# Patient Record
Sex: Female | Born: 1990 | Race: White | Hispanic: Yes | Marital: Single | Smoking: Never smoker
Health system: Southern US, Community
[De-identification: ages and names within clinical notes are randomized; demographics above are authoritative.]

## PROBLEM LIST (undated history)

## (undated) DIAGNOSIS — T7840XA Allergy, unspecified, initial encounter: Secondary | ICD-10-CM

## (undated) HISTORY — PX: APPENDECTOMY: SHX54

## (undated) HISTORY — DX: Allergy, unspecified, initial encounter: T78.40XA

---

## 2010-06-04 ENCOUNTER — Observation Stay: Payer: Self-pay | Admitting: Surgery

## 2012-04-20 IMAGING — CT CT ABD-PELV W/ CM
1 of 3 series · 15 of 32 positions shown, 19 images · non-contrast
Comparison: none

REASON FOR EXAM: (1) rlq pain; (2) rlq pain
COMMENTS:

PROCEDURE:     CT  - CT ABDOMEN / PELVIS  W  - June 03, 2010 [DATE]
RESULT:     CT pelvis dated 06/03/2010.
TECHNIQUE: Helical 3 mm sections were obtained from the lung bases through
the pubic symphysis status post intravenous administration of 100 mL of
Vsovue-CX8 and oral contrast.

[Series 2: appendicitis · axial · 0.69mm/px · z∈[-527,-128]mm · 15 of 145 slices shown, 19 images]
[im 6/145  soft-tissue]
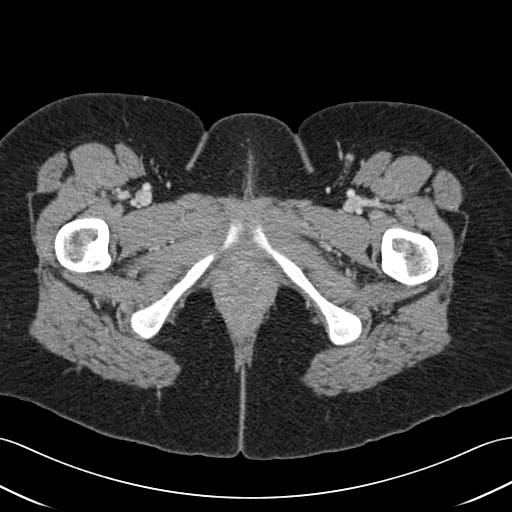
[im 6/145  bone]
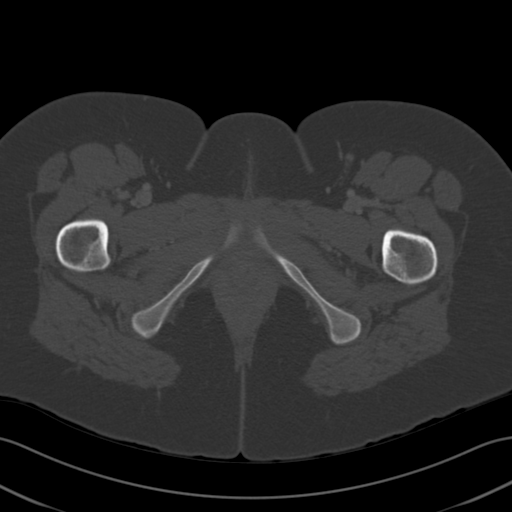
[im 18/145  soft-tissue]
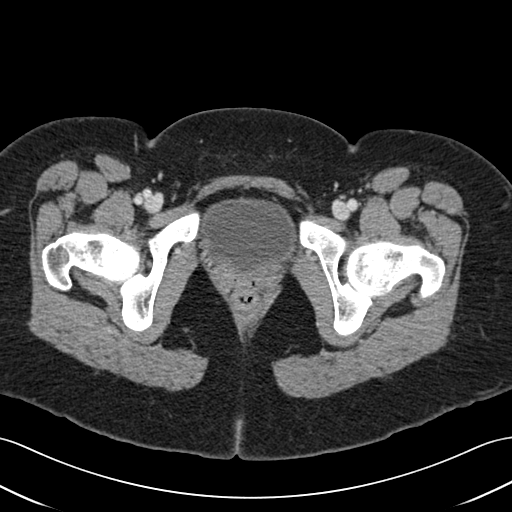
[im 29/145  soft-tissue]
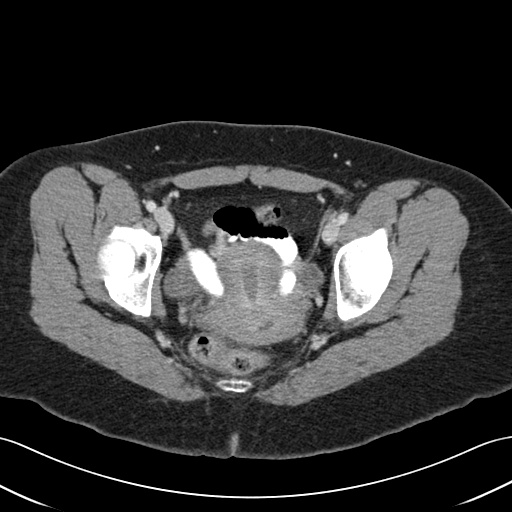
[im 41/145  soft-tissue]
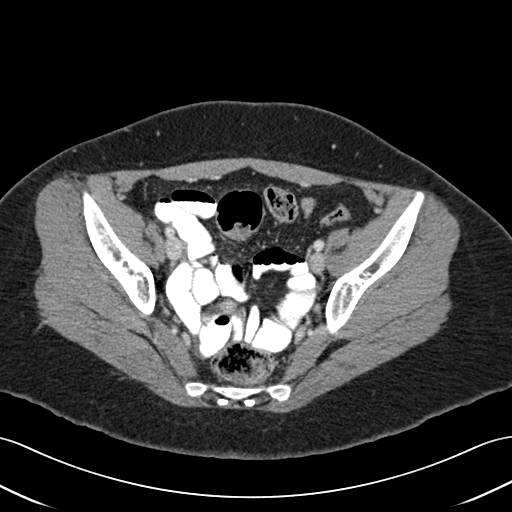
[im 52/145  soft-tissue]
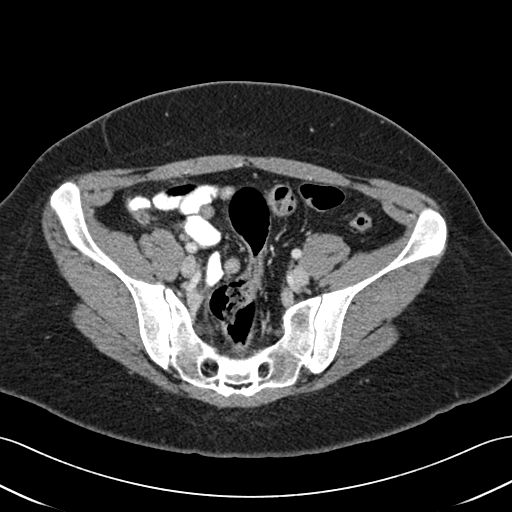
[im 64/145  soft-tissue]
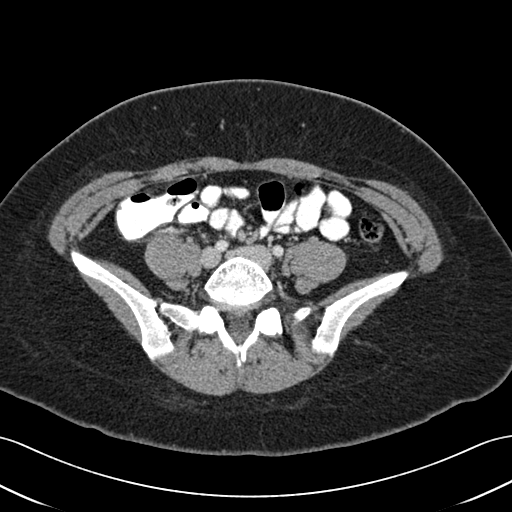
[im 75/145  soft-tissue]
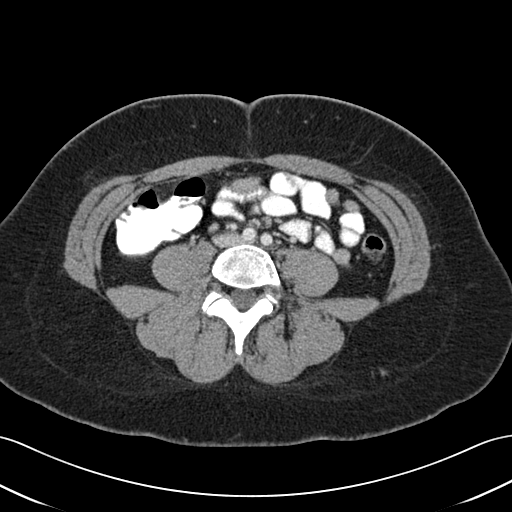
[im 81/145  soft-tissue]
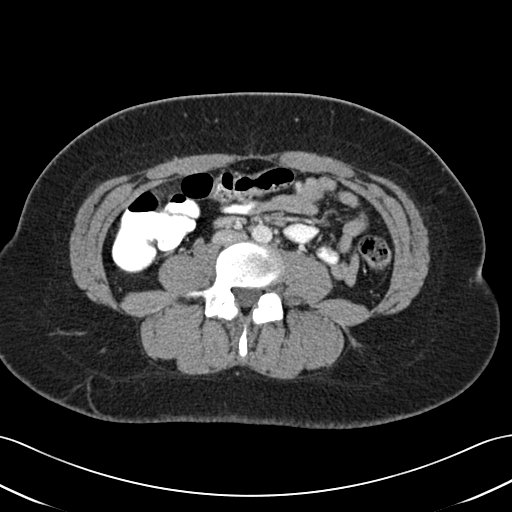
[im 93/145  soft-tissue]
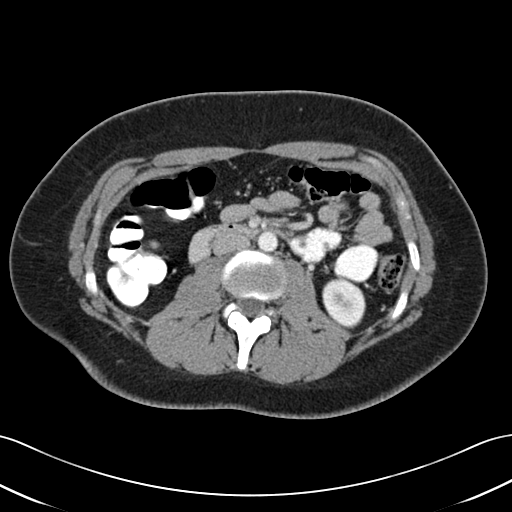
[im 93/145  bone]
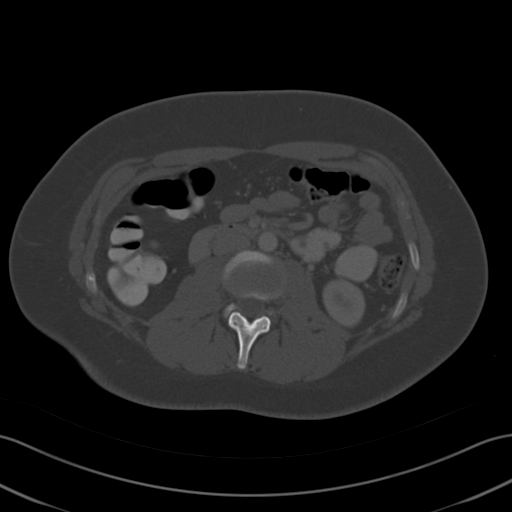
[im 104/145  soft-tissue]
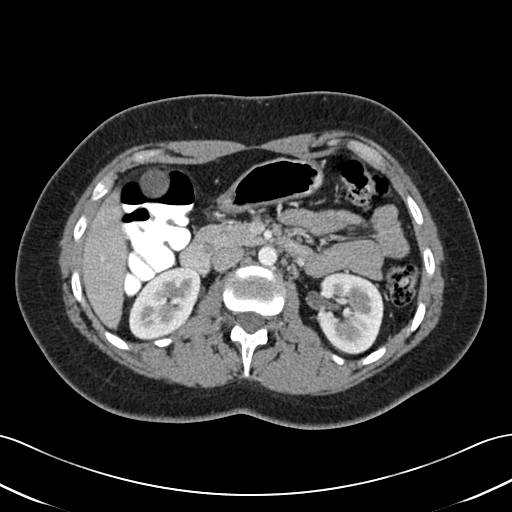
[im 116/145  soft-tissue]
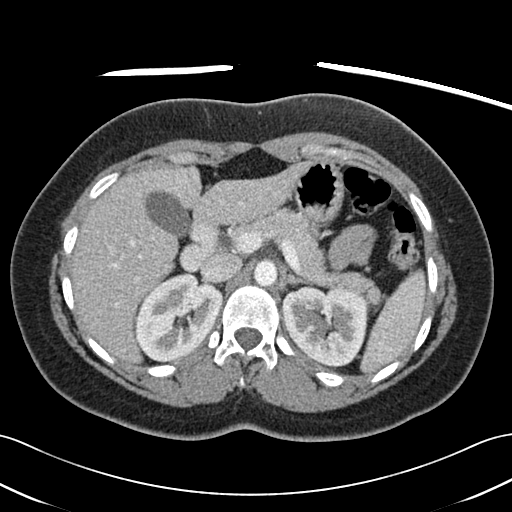
[im 121/145  lung]
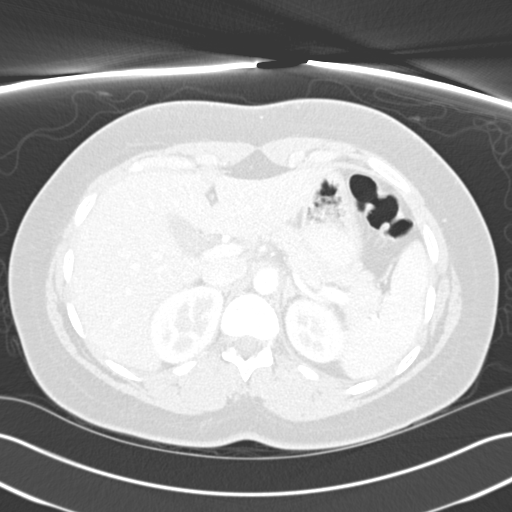
[im 127/145  soft-tissue]
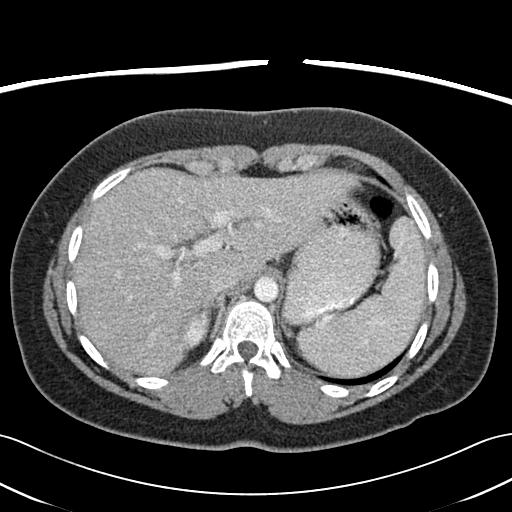
[im 127/145  lung]
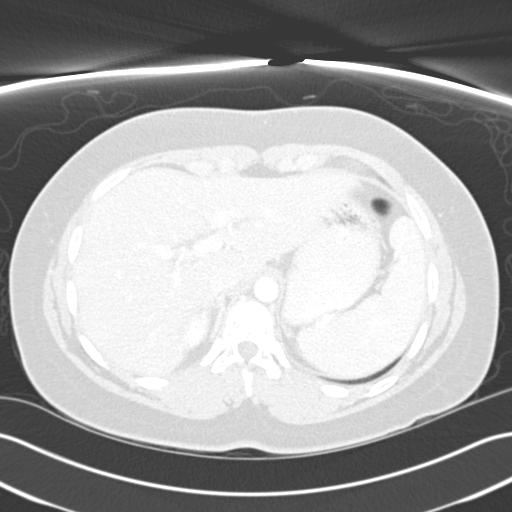
[im 133/145  lung]
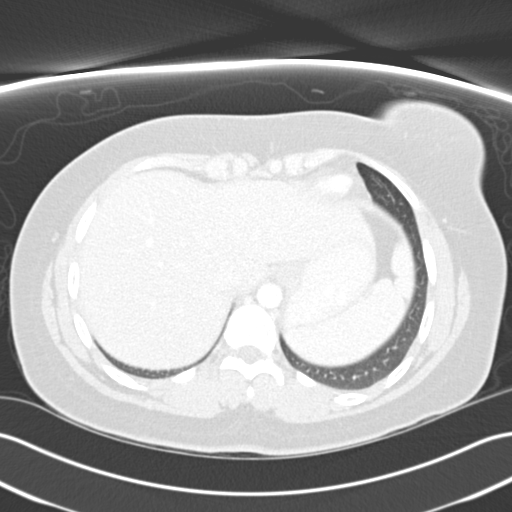
[im 139/145  soft-tissue]
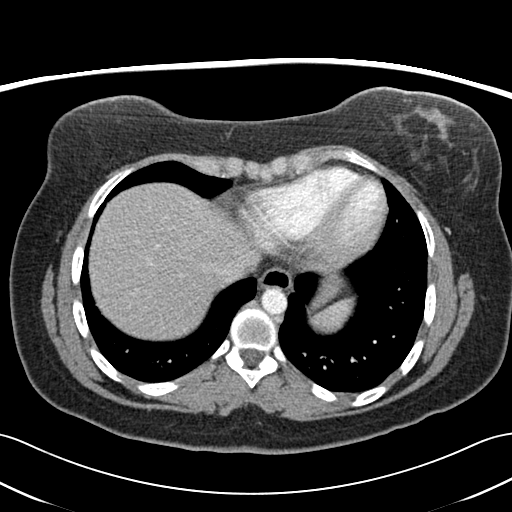
[im 139/145  lung]
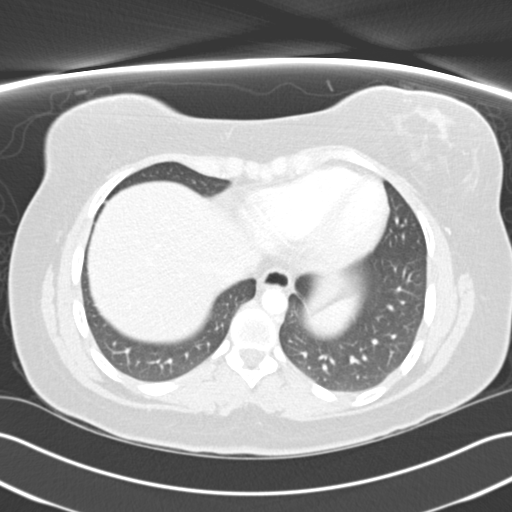

[15 of 32 positions shown; findings below may reference images not displayed]

FINDINGS: The lung bases are unremarkable. The liver, spleen, adrenals,
pancreas, kidneys are unremarkable. There is no evidence of abdominal aortic
aneurysm.

Evaluation of the right paracolic gutter region demonstrates a fluid filled
dilated appendix measuring 8.1 mm in diameter. There is minimal induration
within the periappendiceal fat. There is no evidence of an associated
drainable loculated fluid collection. A small amount of periappendiceal free
fluid appreciated. Within the proximal portion of the appendix an area of
increased density is appreciated representing either contrast within the
appendiceal orifice possibly an appendicolith. The appendix is retrocecal
and rests upon the psoas musculature.

There is no evidence of bowel obstruction, enteritis, colitis nor
diverticulitis.
IMPRESSION: CT findings consistent with early versus mild within the
sinus.
2. Dr. Lary of the emergency department was informed of these findings via a
direct telephone call on 06/04/2010 at [DATE] a.m. Eastern standard time.

## 2019-04-08 ENCOUNTER — Other Ambulatory Visit: Payer: Self-pay

## 2019-04-08 DIAGNOSIS — Z20828 Contact with and (suspected) exposure to other viral communicable diseases: Secondary | ICD-10-CM

## 2019-04-08 DIAGNOSIS — Z20822 Contact with and (suspected) exposure to covid-19: Secondary | ICD-10-CM

## 2019-04-10 LAB — NOVEL CORONAVIRUS, NAA: SARS-CoV-2, NAA: NOT DETECTED

## 2019-05-26 ENCOUNTER — Other Ambulatory Visit: Payer: Self-pay

## 2019-05-26 DIAGNOSIS — Z20822 Contact with and (suspected) exposure to covid-19: Secondary | ICD-10-CM

## 2019-05-27 LAB — NOVEL CORONAVIRUS, NAA: SARS-CoV-2, NAA: NOT DETECTED

## 2019-09-06 ENCOUNTER — Other Ambulatory Visit: Payer: Self-pay

## 2019-09-06 ENCOUNTER — Ambulatory Visit: Payer: Self-pay | Attending: Internal Medicine

## 2019-09-06 DIAGNOSIS — Z23 Encounter for immunization: Secondary | ICD-10-CM | POA: Insufficient documentation

## 2019-09-06 NOTE — Progress Notes (Signed)
   Covid-19 Vaccination Clinic  Name:  ADILYNNE FITZWATER    MRN: [id number] DOB: 1991-05-05  09/06/2019  Ms. Eckerson was observed post Covid-19 immunization for 15 minutes without incident. She was provided with Vaccine Information Sheet and instruction to access the V-Safe system.   Ms. Jamison was instructed to call 911 with any severe reactions post vaccine: Marland Kitchen Difficulty breathing  . Swelling of face and throat  . A fast heartbeat  . A bad rash all over body  . Dizziness and weakness   Immunizations Administered    Name Date Dose VIS Date Route   Moderna COVID-19 Vaccine 09/06/2019  2:44 PM 0.5 mL 06/03/2019 Intramuscular   Manufacturer: Moderna   Lot: [id number]   NDC: 14643-142-76

## 2019-09-08 ENCOUNTER — Ambulatory Visit: Payer: Self-pay

## 2019-10-04 ENCOUNTER — Ambulatory Visit: Payer: Self-pay

## 2019-10-04 ENCOUNTER — Ambulatory Visit: Payer: Self-pay | Attending: Internal Medicine

## 2019-10-04 DIAGNOSIS — Z23 Encounter for immunization: Secondary | ICD-10-CM

## 2019-10-04 NOTE — Progress Notes (Signed)
   Covid-19 Vaccination Clinic  Name:  Melanie Bailey    MRN: [id number] DOB: 04/01/1991  10/04/2019  [name] was observed post Covid-19 immunization for 15 minutes without incident. She was provided with Vaccine Information Sheet and instruction to access the V-Safe system.   Ms. Show was instructed to call 911 with any severe reactions post vaccine: Marland Kitchen Difficulty breathing  . Swelling of face and throat  . A fast heartbeat  . A bad rash all over body  . Dizziness and weakness   Immunizations Administered    Name Date Dose VIS Date Route   Moderna COVID-19 Vaccine 10/04/2019  2:44 PM 0.5 mL 06/03/2019 Intramuscular   Manufacturer: Gala Murdoch   Lot: [id number]   NDC: 37357-897-84

## 2020-06-14 ENCOUNTER — Telehealth: Payer: Self-pay

## 2020-06-14 NOTE — Telephone Encounter (Signed)
Called to schedule np appt with Dr Laural Benes in the new year, no answer, left vm

## 2020-06-18 NOTE — Telephone Encounter (Signed)
Patient returned call and was advised to call back in January, 2022 to schedule a NPA with Dr. Laural Benes, patient voice understanding.

## 2020-12-03 ENCOUNTER — Ambulatory Visit: Payer: Self-pay | Admitting: Nurse Practitioner

## 2021-03-08 ENCOUNTER — Encounter: Payer: Self-pay | Admitting: Nurse Practitioner

## 2021-03-08 ENCOUNTER — Ambulatory Visit (INDEPENDENT_AMBULATORY_CARE_PROVIDER_SITE_OTHER): Payer: 59 | Admitting: Nurse Practitioner

## 2021-03-08 ENCOUNTER — Other Ambulatory Visit: Payer: Self-pay

## 2021-03-08 VITALS — BP 114/77 | HR 90 | Temp 98.9°F | Ht 64.0 in | Wt 199.8 lb

## 2021-03-08 DIAGNOSIS — E669 Obesity, unspecified: Secondary | ICD-10-CM | POA: Insufficient documentation

## 2021-03-08 DIAGNOSIS — E6609 Other obesity due to excess calories: Secondary | ICD-10-CM

## 2021-03-08 DIAGNOSIS — Z6834 Body mass index (BMI) 34.0-34.9, adult: Secondary | ICD-10-CM | POA: Diagnosis not present

## 2021-03-08 DIAGNOSIS — Z7689 Persons encountering health services in other specified circumstances: Secondary | ICD-10-CM

## 2021-03-08 DIAGNOSIS — Z0000 Encounter for general adult medical examination without abnormal findings: Secondary | ICD-10-CM | POA: Insufficient documentation

## 2021-03-08 NOTE — Assessment & Plan Note (Signed)
BMI 34.30.  Recommended eating smaller high protein, low fat meals more frequently and exercising 30 mins a day 5 times a week with a goal of 10-15lb weight loss in the next 3 months. Patient voiced their understanding and motivation to adhere to these recommendations.  

## 2021-03-08 NOTE — Assessment & Plan Note (Signed)
New Patient == current needs: - Has not had pap, discussed with patient and will perform at physical in 4 weeks - She will obtain past vaccine records from her mom and provide to PCP for review - Hep C and HIV screening at physical, discussed with patient.

## 2021-03-08 NOTE — Progress Notes (Signed)
New Patient Office Visit  Subjective:  Patient ID: Melanie Bailey, adult    DOB: [birth year]  Age: 30 y.o. MRN: [id number]  CC:  Chief Complaint  Patient presents with   Establish Care    Patient is here to establish care. Patient denies having any concerns at today's visit.     HPI Melanie Bailey presents for new patient visit to establish care.  Introduced to Publishing rights manager role and practice setting.  All questions answered.  Discussed provider/patient relationship and expectations.  Has not seen primary care provider in some time.   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  Mother has multiple sclerosis and grandmother with thyroid issues. Patient reports overall no chronic health issues, but wishes to have PCP for annual physicals.    Has never had a pap smear, not currently sexually active, but was in past.  She can get old immunization records from her mother.    History reviewed. No pertinent past medical history.  Past Surgical History:  Procedure Laterality Date   APPENDECTOMY      Family History  Problem Relation Age of Onset   Multiple sclerosis Mother     Social History   Socioeconomic History   Marital status: Single    Spouse name: Not on file   Number of children: Not on file   Years of education: Not on file   Highest education level: Not on file  Occupational History   Not on file  Tobacco Use   Smoking status: Never   Smokeless tobacco: Never  Vaping Use   Vaping Use: Never used  Substance and Sexual Activity   Alcohol use: Yes    Comment: socially   Drug use: Never   Sexual activity: Not Currently  Other Topics Concern   Not on file  Social History Narrative   Not on file   Social Determinants of Health   Financial Resource Strain: Low Risk    Difficulty of Paying Living Expenses: Not hard at all  Food Insecurity: No Food Insecurity    Worried About Programme researcher, broadcasting/film/video in the Last Year: Never true   Ran Out of Food in the Last Year: Never true  Transportation Needs: No Transportation Needs   Lack of Transportation (Medical): No   Lack of Transportation (Non-Medical): No  Physical Activity: Inactive   Days of Exercise per Week: 0 days   Minutes of Exercise per Session: 0 min  Stress: Stress Concern Present   Feeling of Stress : To some extent  Social Connections: Socially Isolated   Frequency of Communication with Friends and Family: More than three times a week   Frequency of Social Gatherings with Friends and Family: More than three times a week   Attends Religious Services: Never   Database administrator or Organizations: No   Attends Engineer, structural: Never   Marital Status: Never married  Catering manager Violence: Not At Risk   Fear of Current or Ex-Partner: No   Emotionally Abused: No   Physically Abused: No   Sexually Abused: No    ROS Review of Systems  Constitutional:  Negative for activity change, appetite change, diaphoresis, fatigue and fever.  Respiratory:  Negative for cough, chest tightness and shortness of breath.   Cardiovascular:  Negative for chest pain, palpitations and leg swelling.  Gastrointestinal: Negative.   Neurological: Negative.  Psychiatric/Behavioral: Negative.     Objective:   Today's Vitals: BP 114/77   Pulse 90   Temp 98.9 F (37.2 C) (Oral)   Ht 5\' 4"  (1.626 m)   Wt 199 lb 12.8 oz (90.6 kg)   LMP 02/11/2021   SpO2 98%   BMI 34.30 kg/m   Physical Exam Vitals and nursing note reviewed.  Constitutional:      General: Melanie Bailey is awake. Melanie Bailey is not in acute distress.    Appearance: Melanie Bailey is well-developed and well-groomed. Melanie Bailey is obese. Melanie Bailey is not ill-appearing or toxic-appearing.  HENT:     Head: Normocephalic.     Right Ear: Hearing normal.     Left Ear: Hearing normal.  Eyes:      General: Lids are normal.        Right eye: No discharge.        Left eye: No discharge.     Conjunctiva/sclera: Conjunctivae normal.     Pupils: Pupils are equal, round, and reactive to light.  Neck:     Thyroid: No thyromegaly.     Vascular: No carotid bruit.  Cardiovascular:     Rate and Rhythm: Normal rate and regular rhythm.     Heart sounds: Normal heart sounds. No murmur heard.   No gallop.  Pulmonary:     Effort: Pulmonary effort is normal. No accessory muscle usage or respiratory distress.     Breath sounds: Normal breath sounds.  Abdominal:     General: Bowel sounds are normal.     Palpations: Abdomen is soft.  Musculoskeletal:     Cervical back: Normal range of motion and neck supple.     Right lower leg: No edema.     Left lower leg: No edema.  Lymphadenopathy:     Cervical: No cervical adenopathy.  Skin:    General: Skin is warm and dry.  Neurological:     Mental Status: Melanie Bailey is alert and oriented to person, place, and time.  Psychiatric:        Attention and Perception: Attention normal.        Mood and Affect: Mood normal.        Speech: Speech normal.        Behavior: Behavior normal. Behavior is cooperative.        Thought Content: Thought content normal.   Assessment & Plan:   Problem List Items Addressed This Visit       Other   Obesity    BMI 34.30.  Recommended eating smaller high protein, low fat meals more frequently and exercising 30 mins a day 5 times a week with a goal of 10-15lb weight loss in the next 3 months. Patient voiced their understanding and motivation to adhere to these recommendations.       Healthcare maintenance    New Patient == current needs: - Has not had pap, discussed with patient and will perform at physical in 4 weeks - She will obtain past vaccine records from her mom and provide to PCP for review - Hep C and HIV screening at physical, discussed with patient.      Other Visit Diagnoses     Encounter  to establish care    -  Primary   Introduced to practice and provider.       Outpatient Encounter Medications as of 03/08/2021  Medication Sig   loratadine (CLARITIN) 10 MG tablet Take 10 mg by  mouth daily.   No facility-administered encounter medications on file as of 03/08/2021.    Follow-up: Return in about 4 weeks (around 04/05/2021) for Annual physical with pap.   Marjie Skiff, NP

## 2021-03-08 NOTE — Patient Instructions (Signed)

## 2021-04-06 ENCOUNTER — Encounter: Payer: Self-pay | Admitting: Family Medicine

## 2021-04-06 ENCOUNTER — Telehealth (INDEPENDENT_AMBULATORY_CARE_PROVIDER_SITE_OTHER): Payer: 59 | Admitting: Family Medicine

## 2021-04-06 DIAGNOSIS — R300 Dysuria: Secondary | ICD-10-CM | POA: Diagnosis not present

## 2021-04-06 DIAGNOSIS — N3000 Acute cystitis without hematuria: Secondary | ICD-10-CM | POA: Diagnosis not present

## 2021-04-06 LAB — URINALYSIS, ROUTINE W REFLEX MICROSCOPIC
Bilirubin, UA: NEGATIVE
Glucose, UA: NEGATIVE
Ketones, UA: NEGATIVE
Nitrite, UA: NEGATIVE
Protein,UA: NEGATIVE
Specific Gravity, UA: 1.02 (ref 1.005–1.030)
Urobilinogen, Ur: 0.2 mg/dL (ref 0.2–1.0)
pH, UA: 6.5 (ref 5.0–7.5)

## 2021-04-06 LAB — MICROSCOPIC EXAMINATION

## 2021-04-06 MED ORDER — NITROFURANTOIN MONOHYD MACRO 100 MG PO CAPS: 100.0000 mg | ORAL_CAPSULE | Freq: Two times a day (BID) | ORAL | 0 refills | Status: DC

## 2021-04-06 NOTE — Progress Notes (Signed)
There were no vitals taken for this visit.   Subjective:    Patient ID: Melanie Bailey, adult    DOB: 1990-12-17, 30 y.o.   MRN: [id number]  HPI: Melanie Bailey is a 30 y.o. adult  Chief Complaint  Patient presents with   Urinary Frequency   Dysuria    Started last night. Patient has taken Azo this morning   URINARY SYMPTOMS Duration: 1 day Dysuria: yes Urinary frequency: yes Urgency: yes Small volume voids: yes Symptom severity: moderate Urinary incontinence: no Foul odor: no Hematuria: no Abdominal pain: no Back pain: no Suprapubic pain/pressure: yes Flank pain: no Fever:  subjective Vomiting: no Relief with cranberry juice: no Relief with pyridium: yes Status: worse Previous urinary tract infection: yes Recurrent urinary tract infection: no History of sexually transmitted disease: no Vaginal discharge: no Treatments attempted: pyridium   Relevant past medical, surgical, family and social history reviewed and updated as indicated. Interim medical history since our last visit reviewed. Allergies and medications reviewed and updated.  Review of Systems  Constitutional: Negative.   Respiratory: Negative.    Cardiovascular: Negative.   Gastrointestinal: Negative.   Genitourinary:  Positive for dysuria, frequency and urgency. Negative for decreased urine volume, difficulty urinating, dyspareunia, enuresis, flank pain, genital sores, hematuria, menstrual problem, pelvic pain, vaginal bleeding, vaginal discharge and vaginal pain.  Psychiatric/Behavioral: Negative.     Per HPI unless specifically indicated above     Objective:    There were no vitals taken for this visit.  Wt Readings from Last 3 Encounters:  03/08/21 199 lb 12.8 oz (90.6 kg)    Physical Exam Vitals and nursing note reviewed.  Constitutional:      General: Melanie Bailey is not in acute distress.    Appearance: Normal appearance. Melanie Bailey is not ill-appearing,  toxic-appearing or diaphoretic.  HENT:     Head: Normocephalic and atraumatic.     Right Ear: External ear normal.     Left Ear: External ear normal.     Nose: Nose normal.     Mouth/Throat:     Mouth: Mucous membranes are moist.     Pharynx: Oropharynx is clear.  Eyes:     General: No scleral icterus.       Right eye: No discharge.        Left eye: No discharge.     Conjunctiva/sclera: Conjunctivae normal.     Pupils: Pupils are equal, round, and reactive to light.  Pulmonary:     Effort: Pulmonary effort is normal. No respiratory distress.     Comments: Speaking in full sentences Musculoskeletal:        General: Normal range of motion.     Cervical back: Normal range of motion.  Skin:    Coloration: Skin is not jaundiced or pale.     Findings: No bruising, erythema, lesion or rash.  Neurological:     Mental Status: Melanie Bailey is alert and oriented to person, place, and time. Mental status is at baseline.  Psychiatric:        Mood and Affect: Mood normal.        Behavior: Behavior normal.        Thought Content: Thought content normal.        Judgment: Judgment normal.    Results for orders placed or performed in visit on 05/26/19  Novel Coronavirus, NAA ([facility])   Specimen: Nasopharyngeal(NP) swabs in vial transport medium   NASOPHARYNGE  TESTING  Result Value  Ref Range   SARS-CoV-2, NAA Not Detected Not Detected      Assessment & Plan:   Problem List Items Addressed This Visit   None Visit Diagnoses     Acute cystitis without hematuria    -  Primary   Will come in to drop off urine. Start nitrofurantoin. Call with any concerns or if not gettng better.    Relevant Orders   Urinalysis, Routine w reflex microscopic   Urine Culture   Dysuria       Relevant Orders   Urinalysis, Routine w reflex microscopic        Follow up plan: Return if symptoms worsen or fail to improve.    This visit was completed via video visit through MyChart due to the  restrictions of the COVID-19 pandemic. All issues as above were discussed and addressed. Physical exam was done as above through visual confirmation on video through MyChart. If it was felt that the patient should be evaluated in the office, they were directed there. The patient verbally consented to this visit. Location of the patient: home Location of the provider: work Those involved with this call:  Provider: Olevia Perches, DO CMA: Tristan Schroeder, CMA Front Desk/Registration: Kandice Hams  Time spent on call:  15 minutes with patient face to face via video conference. More than 50% of this time was spent in counseling and coordination of care. 23 minutes total spent in review of patient's record and preparation of their chart.

## 2021-04-06 NOTE — Addendum Note (Signed)
Addended byMortimer Fries on: 04/06/2021 12:01 PM   Modules accepted: Orders

## 2021-04-09 LAB — URINE CULTURE: Organism ID, Bacteria: NO GROWTH

## 2021-04-12 ENCOUNTER — Other Ambulatory Visit: Payer: Self-pay

## 2021-04-12 ENCOUNTER — Encounter: Payer: Self-pay | Admitting: Nurse Practitioner

## 2021-04-12 ENCOUNTER — Ambulatory Visit (INDEPENDENT_AMBULATORY_CARE_PROVIDER_SITE_OTHER): Payer: 59 | Admitting: Nurse Practitioner

## 2021-04-12 ENCOUNTER — Other Ambulatory Visit (HOSPITAL_COMMUNITY)
Admission: RE | Admit: 2021-04-12 | Discharge: 2021-04-12 | Disposition: A | Discharge status: Home / Self Care | Payer: 59 | Source: Ambulatory Visit | Attending: Nurse Practitioner | Admitting: Nurse Practitioner

## 2021-04-12 VITALS — BP 114/80 | HR 76 | Temp 98.5°F | Ht 63.5 in | Wt 201.6 lb

## 2021-04-12 DIAGNOSIS — E6609 Other obesity due to excess calories: Secondary | ICD-10-CM | POA: Diagnosis not present

## 2021-04-12 DIAGNOSIS — Z6834 Body mass index (BMI) 34.0-34.9, adult: Secondary | ICD-10-CM

## 2021-04-12 DIAGNOSIS — Z0000 Encounter for general adult medical examination without abnormal findings: Secondary | ICD-10-CM

## 2021-04-12 DIAGNOSIS — Z124 Encounter for screening for malignant neoplasm of cervix: Secondary | ICD-10-CM | POA: Insufficient documentation

## 2021-04-12 DIAGNOSIS — E559 Vitamin D deficiency, unspecified: Secondary | ICD-10-CM

## 2021-04-12 DIAGNOSIS — Z8249 Family history of ischemic heart disease and other diseases of the circulatory system: Secondary | ICD-10-CM | POA: Diagnosis not present

## 2021-04-12 LAB — URINALYSIS, ROUTINE W REFLEX MICROSCOPIC
Bilirubin, UA: NEGATIVE
Glucose, UA: NEGATIVE
Ketones, UA: NEGATIVE
Leukocytes,UA: NEGATIVE
Nitrite, UA: NEGATIVE
Protein,UA: NEGATIVE
RBC, UA: NEGATIVE
Specific Gravity, UA: 1.025 (ref 1.005–1.030)
Urobilinogen, Ur: 0.2 mg/dL (ref 0.2–1.0)
pH, UA: 5.5 (ref 5.0–7.5)

## 2021-04-12 NOTE — Assessment & Plan Note (Signed)
BMI 35.15.  Recommended eating smaller high protein, low fat meals more frequently and exercising 30 mins a day 5 times a week with a goal of 10-15lb weight loss in the next 3 months. Patient voiced their understanding and motivation to adhere to these recommendations. ° °

## 2021-04-12 NOTE — Patient Instructions (Signed)

## 2021-04-12 NOTE — Progress Notes (Signed)
BP 114/80   Pulse 76   Temp 98.5 F (36.9 C) (Oral)   Ht 5' 3.5" (1.613 m)   Wt 201 lb 9.6 oz (91.4 kg)   LMP 04/06/2021 (Approximate)   SpO2 100%   BMI 35.15 kg/m    Subjective:    Patient ID: Melanie Bailey, adult    DOB: 11-May-1991, 30 y.o.   MRN: [id number]  HPI: Melanie Bailey is a 30 y.o. adult presenting on 04/12/2021 for comprehensive medical examination. Current medical complaints include:none  She currently lives with: room mates Menopausal Symptoms: no  Depression Screen done today and results listed below:  Depression screen [facility] 2/9 04/06/2021 03/08/2021  Decreased Interest 2 1  Down, Depressed, Hopeless 2 1  PHQ - 2 Score 4 2  Altered sleeping 0 0  Tired, decreased energy 1 1  Change in appetite 0 0  Feeling bad or failure about yourself  1 1  Trouble concentrating 2 0  Moving slowly or fidgety/restless 0 0  Suicidal thoughts 0 0  PHQ-9 Score 8 4  Difficult doing work/chores - Somewhat difficult    The patient does not have a history of falls. I did not complete a risk assessment for falls. A plan of care for falls was not documented.  Functional Status Survey: Is the patient deaf or have difficulty hearing?: No Does the patient have difficulty seeing, even when wearing glasses/contacts?: No Does the patient have difficulty concentrating, remembering, or making decisions?: No Does the patient have difficulty walking or climbing stairs?: No Does the patient have difficulty dressing or bathing?: No Does the patient have difficulty doing errands alone such as visiting a doctor's office or shopping?: No   Past Medical History:  History reviewed. No pertinent past medical history.  Surgical History:  Past Surgical History:  Procedure Laterality Date   APPENDECTOMY      Medications:  Current Outpatient Medications on File Prior to Visit  Medication Sig   FIBER ADULT GUMMIES PO Take 2 tablets by mouth daily.   loratadine (CLARITIN) 10 MG tablet  Take 10 mg by mouth daily.   nitrofurantoin, macrocrystal-monohydrate, (MACROBID) 100 MG capsule Take 1 capsule (100 mg total) by mouth 2 (two) times daily.   No current facility-administered medications on file prior to visit.    Allergies:  No Known Allergies  Social History:  Social History   Socioeconomic History   Marital status: Single    Spouse name: Not on file   Number of children: Not on file   Years of education: Not on file   Highest education level: Not on file  Occupational History   Not on file  Tobacco Use   Smoking status: Never   Smokeless tobacco: Never  Vaping Use   Vaping Use: Never used  Substance and Sexual Activity   Alcohol use: Yes    Comment: socially   Drug use: Never   Sexual activity: Not Currently  Other Topics Concern   Not on file  Social History Narrative   Not on file   Social Determinants of Health   Financial Resource Strain: Low Risk    Difficulty of Paying Living Expenses: Not hard at all  Food Insecurity: No Food Insecurity   Worried About Programme researcher, broadcasting/film/video in the Last Year: Never true   Ran Out of Food in the Last Year: Never true  Transportation Needs: No Transportation Needs   Lack of Transportation (Medical): No   Lack of Transportation (Non-Medical): No  Physical Activity: Inactive   Days of Exercise per Week: 0 days   Minutes of Exercise per Session: 0 min  Stress: Stress Concern Present   Feeling of Stress : To some extent  Social Connections: Socially Isolated   Frequency of Communication with Friends and Family: More than three times a week   Frequency of Social Gatherings with Friends and Family: More than three times a week   Attends Religious Services: Never   Database administrator or Organizations: No   Attends Engineer, structural: Never   Marital Status: Never married  Catering manager Violence: Not At Risk   Fear of Current or Ex-Partner: No   Emotionally Abused: No   Physically Abused: No    Sexually Abused: No   Social History   Tobacco Use  Smoking Status Never  Smokeless Tobacco Never   Social History   Substance and Sexual Activity  Alcohol Use Yes   Comment: socially    Family History:  Family History  Problem Relation Age of Onset   Multiple sclerosis Mother     Past medical history, surgical history, medications, allergies, family history and social history reviewed with patient today and changes made to appropriate areas of the chart.   ROS All other ROS negative except what is listed above and in the HPI.      Objective:    BP 114/80   Pulse 76   Temp 98.5 F (36.9 C) (Oral)   Ht 5' 3.5" (1.613 m)   Wt 201 lb 9.6 oz (91.4 kg)   LMP 04/06/2021 (Approximate)   SpO2 100%   BMI 35.15 kg/m   Wt Readings from Last 3 Encounters:  04/12/21 201 lb 9.6 oz (91.4 kg)  03/08/21 199 lb 12.8 oz (90.6 kg)    Physical Exam Vitals and nursing note reviewed. Exam conducted with a chaperone present.  Constitutional:      General: [name] is awake. Melanie Bailey is not in acute distress.    Appearance: Melanie Bailey is well-developed and well-groomed. Melanie Bailey is obese. Melanie Bailey is not ill-appearing or toxic-appearing.  HENT:     Head: Normocephalic and atraumatic.     Right Ear: Hearing, tympanic membrane, ear canal and external ear normal.     Left Ear: Hearing, tympanic membrane, ear canal and external ear normal.     Nose: Nose normal.     Right Sinus: No maxillary sinus tenderness or frontal sinus tenderness.     Left Sinus: No maxillary sinus tenderness or frontal sinus tenderness.  Eyes:     General:        Right eye: No discharge.        Left eye: No discharge.     Conjunctiva/sclera: Conjunctivae normal.     Pupils: Pupils are equal, round, and reactive to light.  Neck:     Thyroid: No thyromegaly.     Vascular: No carotid bruit or JVD.  Cardiovascular:     Rate and Rhythm: Normal rate and regular  rhythm.     Heart sounds: Normal heart sounds.  Pulmonary:     Effort: Pulmonary effort is normal.     Breath sounds: Normal breath sounds.  Chest:  Breasts:    Right: Normal.     Left: Normal.     Comments: Nipple to left with piercing Abdominal:     General: Bowel sounds are normal.     Palpations: Abdomen is soft. There is no  hepatomegaly or splenomegaly.     Hernia: There is no hernia in the left inguinal area or right inguinal area.  Genitourinary:    Exam position: Lithotomy position.     Labia:        Right: No rash.        Left: No rash.      Urethra: No prolapse.     Vagina: Normal.     Cervix: Normal.     Uterus: Normal.      Adnexa: Right adnexa normal and left adnexa normal.     Comments: Cervix posterior, viewed, and pap sample obtained. Musculoskeletal:        General: Normal range of motion.     Cervical back: Normal range of motion and neck supple.  Lymphadenopathy:     Cervical: No cervical adenopathy.     Upper Body:     Right upper body: No supraclavicular, axillary or pectoral adenopathy.     Left upper body: No supraclavicular, axillary or pectoral adenopathy.  Skin:    General: Skin is warm and dry.  Neurological:     Mental Status: DONELL SLIWINSKI is alert and oriented to person, place, and time.     Deep Tendon Reflexes: Reflexes are normal and symmetric.     Reflex Scores:      Brachioradialis reflexes are 2+ on the right side and 2+ on the left side.      Patellar reflexes are 2+ on the right side and 2+ on the left side. Psychiatric:        Attention and Perception: Attention normal.        Mood and Affect: Mood normal.        Speech: Speech normal.        Behavior: Behavior normal. Behavior is cooperative.        Thought Content: Thought content normal.    Results for orders placed or performed in visit on 04/12/21  Urinalysis, Routine w reflex microscopic  Result Value Ref Range   Specific Gravity, UA 1.025 1.005 - 1.030   pH, UA 5.5  5.0 - 7.5   Color, UA Yellow Yellow   Appearance Ur Clear Clear   Leukocytes,UA Negative Negative   Protein,UA Negative Negative/Trace   Glucose, UA Negative Negative   Ketones, UA Negative Negative   RBC, UA Negative Negative   Bilirubin, UA Negative Negative   Urobilinogen, Ur 0.2 0.2 - 1.0 mg/dL   Nitrite, UA Negative Negative      Assessment & Plan:   Problem List Items Addressed This Visit       Other   Obesity - Primary    BMI 35.15.  Recommended eating smaller high protein, low fat meals more frequently and exercising 30 mins a day 5 times a week with a goal of 10-15lb weight loss in the next 3 months. Patient voiced their understanding and motivation to adhere to these recommendations.       Relevant Orders   CBC with Differential/Platelet   Comprehensive metabolic panel   TSH   Other Visit Diagnoses     Vitamin D deficiency       History of low Vitamin D levels -- recheck today and start supplement as needed.   Relevant Orders   VITAMIN D 25 Hydroxy (Vit-D Deficiency, Fractures)   Family history of cardiac disorder       Lipid panel today   Relevant Orders   Comprehensive metabolic panel   Lipid Panel w/o Chol/HDL Ratio  Cervical cancer screening       Pap obtained today and sent to lab.   Relevant Orders   Cytology - PAP   Encounter for annual physical exam       Annual physical today with labs obtained, health maintenance reviewed -- declines flu and tetanus vaccines.   Relevant Orders   Urinalysis, Routine w reflex microscopic (Completed)   CBC with Differential/Platelet   Comprehensive metabolic panel   Lipid Panel w/o Chol/HDL Ratio   TSH        Follow up plan: Return in about 1 year (around 04/12/2022) for Annual physical.   LABORATORY TESTING:  - Pap smear: pap done  IMMUNIZATIONS:   - Tdap: Tetanus vaccination status reviewed: refused. - Influenza: Refused - Pneumovax: Not applicable - Prevnar: Not applicable - HPV: Not  applicable - Zostavax vaccine: Not applicable  SCREENING: -Mammogram: Not applicable  - Colonoscopy: Not applicable  - Bone Density: Not applicable  -Hearing Test: Not applicable  -Spirometry: Not applicable   PATIENT COUNSELING:   Advised to take 1 mg of folate supplement per day if capable of pregnancy.   Sexuality: Discussed sexually transmitted diseases, partner selection, use of condoms, avoidance of unintended pregnancy  and contraceptive alternatives.   Advised to avoid cigarette smoking.  I discussed with the patient that most people either abstain from alcohol or drink within safe limits (<=14/week and <=4 drinks/occasion for males, <=7/weeks and <= 3 drinks/occasion for females) and that the risk for alcohol disorders and other health effects rises proportionally with the number of drinks per week and how often a drinker exceeds daily limits.  Discussed cessation/primary prevention of drug use and availability of treatment for abuse.   Diet: Encouraged to adjust caloric intake to maintain  or achieve ideal body weight, to reduce intake of dietary saturated fat and total fat, to limit sodium intake by avoiding high sodium foods and not adding table salt, and to maintain adequate dietary potassium and calcium preferably from fresh fruits, vegetables, and low-fat dairy products.    Stressed the importance of regular exercise  Injury prevention: Discussed safety belts, safety helmets, smoke detector, smoking near bedding or upholstery.   Dental health: Discussed importance of regular tooth brushing, flossing, and dental visits.    NEXT PREVENTATIVE PHYSICAL DUE IN 1 YEAR. Return in about 1 year (around 04/12/2022) for Annual physical.

## 2021-04-13 LAB — COMPREHENSIVE METABOLIC PANEL
ALT: 16 IU/L (ref 0–32)
AST: 18 IU/L (ref 0–40)
Albumin/Globulin Ratio: 1.7 (ref 1.2–2.2)
Albumin: 4.7 g/dL (ref 3.8–4.8)
Alkaline Phosphatase: 113 IU/L (ref 44–121)
BUN/Creatinine Ratio: 8 — ABNORMAL LOW (ref 9–23)
BUN: 8 mg/dL (ref 6–20)
Bilirubin Total: 0.3 mg/dL (ref 0.0–1.2)
CO2: 23 mmol/L (ref 20–29)
Calcium: 9.8 mg/dL (ref 8.7–10.2)
Chloride: 100 mmol/L (ref 96–106)
Creatinine, Ser: 0.95 mg/dL (ref 0.57–1.00)
Globulin, Total: 2.8 g/dL (ref 1.5–4.5)
Glucose: 87 mg/dL (ref 70–99)
Potassium: 3.8 mmol/L (ref 3.5–5.2)
Sodium: 137 mmol/L (ref 134–144)
Total Protein: 7.5 g/dL (ref 6.0–8.5)
eGFR: 82 mL/min/{1.73_m2} (ref >59)

## 2021-04-13 LAB — LIPID PANEL W/O CHOL/HDL RATIO
Cholesterol, Total: 165 mg/dL (ref 100–199)
HDL: 46 mg/dL (ref >39)
LDL Chol Calc (NIH): 103 mg/dL — ABNORMAL HIGH (ref 0–99)
Triglycerides: 84 mg/dL (ref 0–149)
VLDL Cholesterol Cal: 16 mg/dL (ref 5–40)

## 2021-04-13 LAB — CBC WITH DIFFERENTIAL/PLATELET
Basophils Absolute: 0 10*3/uL (ref 0.0–0.2)
Basos: 0 %
EOS (ABSOLUTE): 0.1 10*3/uL (ref 0.0–0.4)
Eos: 2 %
Hematocrit: 39.6 % (ref 34.0–46.6)
Hemoglobin: 13.3 g/dL (ref 11.1–15.9)
Immature Grans (Abs): 0 10*3/uL (ref 0.0–0.1)
Immature Granulocytes: 0 %
Lymphocytes Absolute: 2.6 10*3/uL (ref 0.7–3.1)
Lymphs: 33 %
MCH: 30.3 pg (ref 26.6–33.0)
MCHC: 33.6 g/dL (ref 31.5–35.7)
MCV: 90 fL (ref 79–97)
Monocytes Absolute: 0.7 10*3/uL (ref 0.1–0.9)
Monocytes: 9 %
Neutrophils Absolute: 4.6 10*3/uL (ref 1.4–7.0)
Neutrophils: 56 %
Platelets: 305 10*3/uL (ref 150–450)
RBC: 4.39 x10E6/uL (ref 3.77–5.28)
RDW: 12.1 % (ref 11.7–15.4)
WBC: 8.1 10*3/uL (ref 3.4–10.8)

## 2021-04-13 LAB — TSH: TSH: 3.52 u[IU]/mL (ref 0.450–4.500)

## 2021-04-13 LAB — VITAMIN D 25 HYDROXY (VIT D DEFICIENCY, FRACTURES): Vit D, 25-Hydroxy: 28.6 ng/mL — ABNORMAL LOW (ref 30.0–100.0)

## 2021-04-13 NOTE — Progress Notes (Signed)
Contacted via MyChart   Good evening Camyra, it was great seeing you.  Labs have returned and everything looks fantastic with two exceptions.  Vitamin D level is a little low.  I recommend taking Vitamin D3 2000 units daily, which you can obtain over the counter in vitamin section -- this is good for bone health.  Your LDL is elevated.  The LDL is the bad cholesterol. Over time and in combination with inflammation and other factors, this contributes to plaque which in turn may lead to stroke and/or heart attack down the road. Sometimes high LDL is primarily genetic, and people might be eating all the right foods but still have high numbers. Other times, there is room for improvement in one's diet and eating healthier can bring this number down and potentially reduce one's risk of heart attack and/or stroke.   To reduce your LDL, Remember - more fruits and vegetables, more fish, and limit red meat and dairy products. More soy, nuts, beans, barley, lentils, oats and plant sterol ester enriched margarine instead of butter. I also encourage eliminating sugar and processed food. Remember, shop on the outside of the grocery store and visit your [employer]. If you would like to talk with me about dietary changes for your cholesterol, please let me know. We should recheck your cholesterol in 12 months.  Any questions? Keep being awesome!!  Thank you for allowing me to participate in your care.  I appreciate you. Kindest regards, Livier Hendel

## 2021-04-14 LAB — CYTOLOGY - PAP
Comment: NEGATIVE
Diagnosis: NEGATIVE
High risk HPV: NEGATIVE

## 2021-04-14 NOTE — Progress Notes (Signed)
Contacted via MyChart   Good evening Melanie Bailey, your pap has returned and is all normal -- cells are negative for abnormal findings and HPV is negative.  Great news.  Do not need to repeat for 5 years.  Have a great evening. Keep being amazing!!  Thank you for allowing me to participate in your care.  I appreciate you. Kindest regards, Jessia Kief

## 2021-04-28 ENCOUNTER — Telehealth: Payer: Self-pay | Admitting: Nurse Practitioner

## 2021-04-28 NOTE — Telephone Encounter (Signed)
Pt dropped off forms to be completed. Pt wants to know if provider can just fill them out or does she need an appointment. Pt stated that if all questions on the tuberculosis are answered no then she does not need to take the test.  Forms left in provider's folder.

## 2021-04-28 NOTE — Telephone Encounter (Signed)
Will review forms and determine if needs appointment for these.

## 2021-05-02 ENCOUNTER — Encounter: Payer: Self-pay | Admitting: Nurse Practitioner

## 2021-05-02 ENCOUNTER — Other Ambulatory Visit: Payer: Self-pay

## 2021-05-02 ENCOUNTER — Ambulatory Visit (INDEPENDENT_AMBULATORY_CARE_PROVIDER_SITE_OTHER): Payer: 59 | Admitting: Nurse Practitioner

## 2021-05-02 VITALS — BP 114/79 | HR 58 | Ht 63.5 in | Wt 201.0 lb

## 2021-05-02 DIAGNOSIS — Z021 Encounter for pre-employment examination: Secondary | ICD-10-CM

## 2021-05-02 NOTE — Progress Notes (Signed)
Ht 5' 3.5" (1.613 m)   Wt 201 lb (91.2 kg)   LMP 04/06/2021 (Approximate)   SpO2 97%   BMI 35.05 kg/m    Subjective:    Patient ID: Melanie Bailey, adult    DOB: Feb 19, 1991, 30 y.o.   MRN: [id number]  HPI: Melanie Bailey is a 30 y.o. adult  Chief Complaint  Patient presents with   Follow-up   Other     Forms completed   Patient presents to clinic to have form for employment filled out.  Patient denies concerns at visit today.   Denies HA, CP, SOB, dizziness, palpitations, visual changes, and lower extremity swelling.   Relevant past medical, surgical, family and social history reviewed and updated as indicated. Interim medical history since our last visit reviewed. Allergies and medications reviewed and updated.  Review of Systems  Eyes:  Negative for visual disturbance.  Respiratory:  Negative for cough, chest tightness and shortness of breath.   Cardiovascular:  Negative for chest pain, palpitations and leg swelling.  Neurological:  Negative for dizziness and headaches.   Per HPI unless specifically indicated above     Objective:    Ht 5' 3.5" (1.613 m)   Wt 201 lb (91.2 kg)   LMP 04/06/2021 (Approximate)   SpO2 97%   BMI 35.05 kg/m   Wt Readings from Last 3 Encounters:  05/02/21 201 lb (91.2 kg)  04/12/21 201 lb 9.6 oz (91.4 kg)  03/08/21 199 lb 12.8 oz (90.6 kg)    Physical Exam Vitals and nursing note reviewed.  Constitutional:      General: Melanie Bailey is not in acute distress.    Appearance: Normal appearance. Melanie Bailey is normal weight. Melanie Bailey is not ill-appearing, toxic-appearing or diaphoretic.  HENT:     Head: Normocephalic.     Right Ear: External ear normal.     Left Ear: External ear normal.     Nose: Nose normal.     Mouth/Throat:     Mouth: Mucous membranes are moist.     Pharynx: Oropharynx is clear.  Eyes:     General:        Right eye: No discharge.        Left eye: No discharge.     Extraocular  Movements: Extraocular movements intact.     Conjunctiva/sclera: Conjunctivae normal.     Pupils: Pupils are equal, round, and reactive to light.  Cardiovascular:     Rate and Rhythm: Normal rate and regular rhythm.     Heart sounds: No murmur heard. Pulmonary:     Effort: Pulmonary effort is normal. No respiratory distress.     Breath sounds: Normal breath sounds. No wheezing or rales.  Musculoskeletal:     Cervical back: Normal range of motion and neck supple.  Skin:    General: Skin is warm and dry.     Capillary Refill: Capillary refill takes less than 2 seconds.  Neurological:     General: No focal deficit present.     Mental Status: Melanie Bailey is alert and oriented to person, place, and time. Mental status is at baseline.  Psychiatric:        Mood and Affect: Mood normal.        Behavior: Behavior normal.        Thought Content: Thought content normal.        Judgment: Judgment normal.    Results for orders placed or performed in visit on 04/12/21  Urinalysis, Routine w reflex microscopic  Result Value Ref Range   Specific Gravity, UA 1.025 1.005 - 1.030   pH, UA 5.5 5.0 - 7.5   Color, UA Yellow Yellow   Appearance Ur Clear Clear   Leukocytes,UA Negative Negative   Protein,UA Negative Negative/Trace   Glucose, UA Negative Negative   Ketones, UA Negative Negative   RBC, UA Negative Negative   Bilirubin, UA Negative Negative   Urobilinogen, Ur 0.2 0.2 - 1.0 mg/dL   Nitrite, UA Negative Negative  CBC with Differential/Platelet  Result Value Ref Range   WBC 8.1 3.4 - 10.8 x10E3/uL   RBC 4.39 3.77 - 5.28 x10E6/uL   Hemoglobin 13.3 11.1 - 15.9 g/dL   Hematocrit 39.6 34.0 - 46.6 %   MCV 90 79 - 97 fL   MCH 30.3 26.6 - 33.0 pg   MCHC 33.6 31.5 - 35.7 g/dL   RDW 12.1 11.7 - 15.4 %   Platelets 305 150 - 450 x10E3/uL   Neutrophils 56 Not Estab. %   Lymphs 33 Not Estab. %   Monocytes 9 Not Estab. %   Eos 2 Not Estab. %   Basos 0 Not Estab. %   Neutrophils  Absolute 4.6 1.4 - 7.0 x10E3/uL   Lymphocytes Absolute 2.6 0.7 - 3.1 x10E3/uL   Monocytes Absolute 0.7 0.1 - 0.9 x10E3/uL   EOS (ABSOLUTE) 0.1 0.0 - 0.4 x10E3/uL   Basophils Absolute 0.0 0.0 - 0.2 x10E3/uL   Immature Granulocytes 0 Not Estab. %   Immature Grans (Abs) 0.0 0.0 - 0.1 x10E3/uL  Comprehensive metabolic panel  Result Value Ref Range   Glucose 87 70 - 99 mg/dL   BUN 8 6 - 20 mg/dL   Creatinine, Ser 0.95 0.57 - 1.00 mg/dL   eGFR 82 >59 mL/min/1.73   BUN/Creatinine Ratio 8 (L) 9 - 23   Sodium 137 134 - 144 mmol/L   Potassium 3.8 3.5 - 5.2 mmol/L   Chloride 100 96 - 106 mmol/L   CO2 23 20 - 29 mmol/L   Calcium 9.8 8.7 - 10.2 mg/dL   Total Protein 7.5 6.0 - 8.5 g/dL   Albumin 4.7 3.8 - 4.8 g/dL   Globulin, Total 2.8 1.5 - 4.5 g/dL   Albumin/Globulin Ratio 1.7 1.2 - 2.2   Bilirubin Total 0.3 0.0 - 1.2 mg/dL   Alkaline Phosphatase 113 44 - 121 IU/L   AST 18 0 - 40 IU/L   ALT 16 0 - 32 IU/L  Lipid Panel w/o Chol/HDL Ratio  Result Value Ref Range   Cholesterol, Total 165 100 - 199 mg/dL   Triglycerides 84 0 - 149 mg/dL   HDL 46 >39 mg/dL   VLDL Cholesterol Cal 16 5 - 40 mg/dL   LDL Chol Calc (NIH) 103 (H) 0 - 99 mg/dL  TSH  Result Value Ref Range   TSH 3.520 0.450 - 4.500 uIU/mL  VITAMIN D 25 Hydroxy (Vit-D Deficiency, Fractures)  Result Value Ref Range   Vit D, 25-Hydroxy 28.6 (L) 30.0 - 100.0 ng/mL  Cytology - PAP  Result Value Ref Range   High risk HPV Negative    Adequacy      Satisfactory for evaluation; transformation zone component PRESENT.   Diagnosis      - Negative for intraepithelial lesion or malignancy (NILM)   Comment Normal Reference Range HPV - Negative       Assessment & Plan:   Problem List Items Addressed This Visit   None Visit Diagnoses  Pre-employment examination    -  Primary        Follow up plan: Return if symptoms worsen or fail to improve.

## 2021-06-01 NOTE — Progress Notes (Signed)
There were no vitals taken for this visit.   Subjective:    Patient ID: Melanie Bailey, adult    DOB: [birth year], 30 y.o.   MRN: [id number]  HPI: Melanie Bailey is a 30 y.o. adult  Chief Complaint  Patient presents with   Covid Positive    Pt states she had a positive rapid covid test on Tuesday night, states she has been having just a runny nose and drainage that started Tuesday morning. States she also took a PCR covid test yesterday morning, awaiting those results.    UPPER RESPIRATORY TRACT INFECTION Worst symptom: COVID positive on Tuesday.  Fever: no Cough: no Shortness of breath: no Wheezing: no Chest pain: no Chest tightness: no Chest congestion: no Nasal congestion: yes Runny nose: yes Post nasal drip: yes Sneezing: yes Sore throat: no Swollen glands: no Sinus pressure: no Headache: no Face pain: no Toothache: no Ear pain: no bilateral Ear pressure: no bilateral Eyes red/itching:no Eye drainage/crusting: no  Vomiting: no Rash: no Fatigue: yes Sick contacts: yes Strep contacts: no  Context: stable Recurrent sinusitis: no Relief with OTC cold/cough medications: no  Treatments attempted: none   Relevant past medical, surgical, family and social history reviewed and updated as indicated. Interim medical history since our last visit reviewed. Allergies and medications reviewed and updated.  Review of Systems  Constitutional:  Positive for fatigue. Negative for fever.  HENT:  Positive for congestion, postnasal drip, rhinorrhea and sneezing. Negative for ear pain, sinus pressure, sinus pain and sore throat.   Respiratory:  Negative for cough, chest tightness, shortness of breath and wheezing.   Gastrointestinal:  Negative for vomiting.  Skin:  Negative for rash.  Neurological:  Negative for headaches.   Per HPI unless specifically indicated above     Objective:    There were no vitals taken for this visit.  Wt Readings from Last 3 Encounters:   05/02/21 201 lb (91.2 kg)  04/12/21 201 lb 9.6 oz (91.4 kg)  03/08/21 199 lb 12.8 oz (90.6 kg)    Physical Exam Vitals and nursing note reviewed.  HENT:     Head: Normocephalic.     Right Ear: Hearing normal.     Left Ear: Hearing normal.     Nose: Nose normal.  Eyes:     Pupils: Pupils are equal, round, and reactive to light.  Pulmonary:     Effort: Pulmonary effort is normal. No respiratory distress.  Neurological:     Mental Status: Melanie Bailey is alert.  Psychiatric:        Mood and Affect: Mood normal.        Behavior: Behavior normal.        Thought Content: Thought content normal.        Judgment: Judgment normal.    Results for orders placed or performed in visit on 04/12/21  Urinalysis, Routine w reflex microscopic  Result Value Ref Range   Specific Gravity, UA 1.025 1.005 - 1.030   pH, UA 5.5 5.0 - 7.5   Color, UA Yellow Yellow   Appearance Ur Clear Clear   Leukocytes,UA Negative Negative   Protein,UA Negative Negative/Trace   Glucose, UA Negative Negative   Ketones, UA Negative Negative   RBC, UA Negative Negative   Bilirubin, UA Negative Negative   Urobilinogen, Ur 0.2 0.2 - 1.0 mg/dL   Nitrite, UA Negative Negative  CBC with Differential/Platelet  Result Value Ref Range   WBC 8.1 3.4 - 10.8 x10E3/uL  RBC 4.39 3.77 - 5.28 x10E6/uL   Hemoglobin 13.3 11.1 - 15.9 g/dL   Hematocrit 39.6 34.0 - 46.6 %   MCV 90 79 - 97 fL   MCH 30.3 26.6 - 33.0 pg   MCHC 33.6 31.5 - 35.7 g/dL   RDW 12.1 11.7 - 15.4 %   Platelets 305 150 - 450 x10E3/uL   Neutrophils 56 Not Estab. %   Lymphs 33 Not Estab. %   Monocytes 9 Not Estab. %   Eos 2 Not Estab. %   Basos 0 Not Estab. %   Neutrophils Absolute 4.6 1.4 - 7.0 x10E3/uL   Lymphocytes Absolute 2.6 0.7 - 3.1 x10E3/uL   Monocytes Absolute 0.7 0.1 - 0.9 x10E3/uL   EOS (ABSOLUTE) 0.1 0.0 - 0.4 x10E3/uL   Basophils Absolute 0.0 0.0 - 0.2 x10E3/uL   Immature Granulocytes 0 Not Estab. %   Immature Grans (Abs) 0.0  0.0 - 0.1 x10E3/uL  Comprehensive metabolic panel  Result Value Ref Range   Glucose 87 70 - 99 mg/dL   BUN 8 6 - 20 mg/dL   Creatinine, Ser 0.95 0.57 - 1.00 mg/dL   eGFR 82 >59 mL/min/1.73   BUN/Creatinine Ratio 8 (L) 9 - 23   Sodium 137 134 - 144 mmol/L   Potassium 3.8 3.5 - 5.2 mmol/L   Chloride 100 96 - 106 mmol/L   CO2 23 20 - 29 mmol/L   Calcium 9.8 8.7 - 10.2 mg/dL   Total Protein 7.5 6.0 - 8.5 g/dL   Albumin 4.7 3.8 - 4.8 g/dL   Globulin, Total 2.8 1.5 - 4.5 g/dL   Albumin/Globulin Ratio 1.7 1.2 - 2.2   Bilirubin Total 0.3 0.0 - 1.2 mg/dL   Alkaline Phosphatase 113 44 - 121 IU/L   AST 18 0 - 40 IU/L   ALT 16 0 - 32 IU/L  Lipid Panel w/o Chol/HDL Ratio  Result Value Ref Range   Cholesterol, Total 165 100 - 199 mg/dL   Triglycerides 84 0 - 149 mg/dL   HDL 46 >39 mg/dL   VLDL Cholesterol Cal 16 5 - 40 mg/dL   LDL Chol Calc (NIH) 103 (H) 0 - 99 mg/dL  TSH  Result Value Ref Range   TSH 3.520 0.450 - 4.500 uIU/mL  VITAMIN D 25 Hydroxy (Vit-D Deficiency, Fractures)  Result Value Ref Range   Vit D, 25-Hydroxy 28.6 (L) 30.0 - 100.0 ng/mL  Cytology - PAP  Result Value Ref Range   High risk HPV Negative    Adequacy      Satisfactory for evaluation; transformation zone component PRESENT.   Diagnosis      - Negative for intraepithelial lesion or malignancy (NILM)   Comment Normal Reference Range HPV - Negative       Assessment & Plan:   Problem List Items Addressed This Visit   None Visit Diagnoses     OEHOZ-22    -  Primary   Discussed OTC symptom management.  Zinc and Vitmain C for immune support. Will hold off on anti viral due to patient having mild sympoms. FU if symptoms worsen.        Follow up plan: Return if symptoms worsen or fail to improve.   This visit was completed via MyChart due to the restrictions of the COVID-19 pandemic. All issues as above were discussed and addressed. Physical exam was done as above through visual confirmation on MyChart. If  it was felt that the patient should be evaluated in the office, they  were directed there. The patient verbally consented to this visit. Location of the patient: Home Location of the provider: Office Those involved with this call:  Provider: Jon Billings, NP CMA: Yvonna Alanis, CMA Front Desk/Registration: [employer] This encounter was conducted via video.  I spent 15 dedicated to the care of this patient on the date of this encounter to include previsit review of 20, face to face time with the patient, and post visit ordering of testing.

## 2021-06-02 ENCOUNTER — Telehealth (INDEPENDENT_AMBULATORY_CARE_PROVIDER_SITE_OTHER): Payer: 59 | Admitting: Nurse Practitioner

## 2021-06-02 ENCOUNTER — Encounter: Payer: Self-pay | Admitting: Nurse Practitioner

## 2021-06-02 DIAGNOSIS — U071 COVID-19: Secondary | ICD-10-CM

## 2021-09-15 ENCOUNTER — Telehealth (INDEPENDENT_AMBULATORY_CARE_PROVIDER_SITE_OTHER): Payer: 59 | Admitting: Internal Medicine

## 2021-09-15 ENCOUNTER — Encounter: Payer: Self-pay | Admitting: Internal Medicine

## 2021-09-15 DIAGNOSIS — R350 Frequency of micturition: Secondary | ICD-10-CM | POA: Insufficient documentation

## 2021-09-15 LAB — URINALYSIS, ROUTINE W REFLEX MICROSCOPIC
Bilirubin, UA: NEGATIVE
Glucose, UA: NEGATIVE
Ketones, UA: NEGATIVE
Nitrite, UA: POSITIVE — AB
Protein,UA: NEGATIVE
Specific Gravity, UA: 1.025 (ref 1.005–1.030)
Urobilinogen, Ur: 0.2 mg/dL (ref 0.2–1.0)
pH, UA: 6 (ref 5.0–7.5)

## 2021-09-15 LAB — MICROSCOPIC EXAMINATION

## 2021-09-15 MED ORDER — SULFAMETHOXAZOLE-TRIMETHOPRIM 800-160 MG PO TABS: 1.0000 | ORAL_TABLET | Freq: Two times a day (BID) | ORAL | 0 refills | Status: AC

## 2021-09-15 NOTE — Progress Notes (Signed)
? ?There were no vitals taken for this visit.  ? ?Subjective:  ? ? Patient ID: Melanie Bailey, adult    DOB: 31-Dec-1990, 31 y.o.   MRN: [id number] ? ?Chief Complaint  ?Patient presents with  ? Urinary Frequency  ?  For 2 days. ?  ? ? ?HPI: ?Melanie Bailey is a 31 y.o. adult ? ? ?This visit was completed via telephone due to the restrictions of the COVID-19 pandemic. All issues as above were discussed and addressed but no physical exam was performed. If it was felt that the patient should be evaluated in the office, they were directed there. The patient verbally consented to this visit. Patient was unable to complete an audio/visual visit due to Technical difficulties. Due to the catastrophic nature of the COVID-19 pandemic, this visit was done through audio contact only. ?Location of the patient: home ?Location of the provider: work ?Those involved with this call:  ?Provider: Loura Pardon, MD ?CMA: Tristan Schroeder, CMA ?Front Desk/Registration: [employer]  ?Time spent on call: 10 minutes on the phone discussing health concerns. 10 minutes total spent in review of patient's record and preparation of their chart. ? ? ? ?Urinary Frequency  ?This is a new problem. The current episode started yesterday (x 2 days has had pressure on urination took azo last evening. helped a lot. has Nitrite +ve on UA). The quality of the pain is described as burning. The pain is at a severity of 4/10. The pain is mild. The maximum temperature recorded prior to [employer] arrival was 101 - 101.9 F. Associated symptoms include frequency and nausea. Pertinent negatives include no chills, discharge, flank pain, hematuria, hesitancy, possible pregnancy, sweats, urgency or vomiting.  ? ?Chief Complaint  ?Patient presents with  ? Urinary Frequency  ?  For 2 days. ?  ? ? ?Relevant past medical, surgical, family and social history reviewed and updated as indicated. Interim medical history since our last visit reviewed. ?Allergies and  medications reviewed and updated. ? ?Review of Systems  ?Constitutional:  Negative for chills.  ?Gastrointestinal:  Positive for nausea. Negative for vomiting.  ?Genitourinary:  Positive for frequency. Negative for flank pain, hematuria, hesitancy and urgency.  ? ?Per HPI unless specifically indicated above ? ?   ?Objective:  ?  ?There were no vitals taken for this visit.  ?Wt Readings from Last 3 Encounters:  ?05/02/21 201 lb (91.2 kg)  ?04/12/21 201 lb 9.6 oz (91.4 kg)  ?03/08/21 199 lb 12.8 oz (90.6 kg)  ?  ?Physical Exam ? ?Results for orders placed or performed in visit on 09/15/21  ?Microscopic Examination  ? Urine  ?Result Value Ref Range  ? WBC, UA 6-10 (A) 0 - 5 /hpf  ? RBC 3-10 (A) 0 - 2 /hpf  ? Epithelial Cells (non renal) 0-10 0 - 10 /hpf  ? Mucus, UA Present (A) Not Estab.  ? Bacteria, UA Few (A) None seen/Few  ?Urinalysis, Routine w reflex microscopic  ?Result Value Ref Range  ? Specific Gravity, UA 1.025 1.005 - 1.030  ? pH, UA 6.0 5.0 - 7.5  ? Color, UA Yellow Yellow  ? Appearance Ur Cloudy (A) Clear  ? Leukocytes,UA 1+ (A) Negative  ? Protein,UA Negative Negative/Trace  ? Glucose, UA Negative Negative  ? Ketones, UA Negative Negative  ? RBC, UA 1+ (A) Negative  ? Bilirubin, UA Negative Negative  ? Urobilinogen, Ur 0.2 0.2 - 1.0 mg/dL  ? Nitrite, UA Positive (A) Negative  ? Microscopic Examination  See below:   ? ?   ? ? ?Current Outpatient Medications:  ?  sulfamethoxazole-trimethoprim (BACTRIM DS) 800-160 MG tablet, Take 1 tablet by mouth 2 (two) times daily for 5 days., Disp: 10 tablet, Rfl: 0 ?  FIBER ADULT GUMMIES PO, Take 2 tablets by mouth daily., Disp: , Rfl:   ? ? ?Assessment & Plan:  ? ?UTI ?check UA.  pt is currently symptomatic for an Urinary tract infection(abd pain, burning etc), will cover with emperic abx, see med module for details.  encouraged to increase water/fluid intake.Signs and symptoms of emergency were discussed with the patient. The risks, benefits and side effects of  treatment were discussed with the patient. The patient verbalized an understanding of plan, and was told to call the clinic/go to the ED if symptoms worsen at any point of time. ? ? ? ? ? ?Problem List Items Addressed This Visit   ? ?  ? Other  ? Frequency of urination - Primary  ? Relevant Orders  ? Urinalysis, Routine w reflex microscopic (Completed)  ? Urine Culture  ?  ? ?Orders Placed This Encounter  ?Procedures  ? Urine Culture  ? Microscopic Examination  ? Urinalysis, Routine w reflex microscopic  ?  ? ?Meds ordered this encounter  ?Medications  ? sulfamethoxazole-trimethoprim (BACTRIM DS) 800-160 MG tablet  ?  Sig: Take 1 tablet by mouth 2 (two) times daily for 5 days.  ?  Dispense:  10 tablet  ?  Refill:  0  ?  ? ?Follow up plan: ?No follow-ups on file. ? ? ?

## 2021-09-17 LAB — URINE CULTURE

## 2022-05-15 ENCOUNTER — Encounter: Payer: 59 | Admitting: Nurse Practitioner

## 2022-05-28 DIAGNOSIS — E7800 Pure hypercholesterolemia, unspecified: Secondary | ICD-10-CM | POA: Insufficient documentation

## 2022-05-28 DIAGNOSIS — E559 Vitamin D deficiency, unspecified: Secondary | ICD-10-CM | POA: Insufficient documentation

## 2022-05-30 ENCOUNTER — Ambulatory Visit (INDEPENDENT_AMBULATORY_CARE_PROVIDER_SITE_OTHER): Payer: 59 | Admitting: Physician Assistant

## 2022-05-30 ENCOUNTER — Encounter: Payer: Self-pay | Admitting: Physician Assistant

## 2022-05-30 VITALS — BP 111/75 | HR 89 | Temp 98.2°F | Wt 201.0 lb

## 2022-05-30 DIAGNOSIS — R300 Dysuria: Secondary | ICD-10-CM

## 2022-05-30 LAB — URINALYSIS, ROUTINE W REFLEX MICROSCOPIC
Bilirubin, UA: NEGATIVE
Glucose, UA: NEGATIVE
Ketones, UA: NEGATIVE
Leukocytes,UA: NEGATIVE
Nitrite, UA: NEGATIVE
Protein,UA: NEGATIVE
Specific Gravity, UA: >1.03 — ABNORMAL HIGH (ref 1.005–1.030)
Urobilinogen, Ur: 0.2 mg/dL (ref 0.2–1.0)
pH, UA: 5 (ref 5.0–7.5)

## 2022-05-30 LAB — MICROSCOPIC EXAMINATION: Bacteria, UA: NONE SEEN

## 2022-05-30 MED ORDER — PHENAZOPYRIDINE HCL 200 MG PO TABS: 200.0000 mg | ORAL_TABLET | Freq: Three times a day (TID) | ORAL | 0 refills | Status: DC | PRN

## 2022-05-30 NOTE — Progress Notes (Signed)
Acute Office Visit   Patient: Melanie Bailey   DOB: Aug 02, 1990   31 y.o. Adult  MRN: [id number] Visit Date: 05/30/2022  Today's healthcare provider: Dani Gobble Rohith Fauth, PA-C  Introduced myself to the patient as a Journalist, newspaper and provided education on APPs in clinical practice.    Chief Complaint  Patient presents with   Dysuria    Patient states they are having urinary frequency and pain with urination for about 5 days.    Subjective    Dysuria  Associated symptoms include flank pain, frequency and urgency. Pertinent negatives include no chills, hematuria, nausea or vomiting.   HPI     Dysuria    Additional comments: Patient states they are having urinary frequency and pain with urination for about 5 days.       Last edited by Almon Register, PA-C on 05/30/2022  4:39 PM.        Concern for UTI   States they are concerned for UTI States symptoms started around Wed or Thurs Symptoms: Increased frequency, dysuria, pain and cramping  Reports they have also started their period around this same time so it is difficult to separate out symptoms Denies hematuria, fever, colicky abdominal pain  Reports mild urinary hesitancy that is intermittent Unsure if they are having flank pain due to period symptoms- some discomfort in that area They deny concerns for yeast infection, BV or STDs at this time    Medications: Outpatient Medications Prior to Visit  Medication Sig   cholecalciferol (VITAMIN D3) 25 MCG (1000 UNIT) tablet Take 1,000 Units by mouth daily.   Cranberry 50 MG CHEW Chew by mouth.   FIBER ADULT GUMMIES PO Take 2 tablets by mouth daily.   No facility-administered medications prior to visit.    Review of Systems  Constitutional:  Negative for chills, fatigue and fever.  Gastrointestinal:  Negative for diarrhea, nausea and vomiting.  Genitourinary:  Positive for dysuria, flank pain, frequency and urgency. Negative for decreased urine volume, difficulty  urinating, enuresis and hematuria.  Neurological:  Positive for headaches.       Objective    BP 111/75   Pulse 89   Temp 98.2 F (36.8 C)   Wt 201 lb (91.2 kg)   SpO2 100%   BMI 35.05 kg/m    Physical Exam Vitals reviewed.  Constitutional:      General: Melanie Bailey is awake.     Appearance: Normal appearance. Melanie Bailey is well-developed and well-groomed.  HENT:     Head: Normocephalic and atraumatic.  Eyes:     General: Lids are normal. Gaze aligned appropriately.     Extraocular Movements: Extraocular movements intact.     Conjunctiva/sclera: Conjunctivae normal.  Pulmonary:     Effort: Pulmonary effort is normal.  Abdominal:     General: Abdomen is flat. Bowel sounds are normal.     Palpations: Abdomen is soft.     Tenderness: There is no abdominal tenderness. There is no right CVA tenderness or left CVA tenderness.  Musculoskeletal:     Cervical back: Normal range of motion.  Neurological:     Mental Status: Melanie Bailey is alert.  Psychiatric:        Attention and Perception: Attention and perception normal.        Mood and Affect: Mood and affect normal.        Speech: Speech normal.        Behavior:  Behavior normal. Behavior is cooperative.       Results for orders placed or performed in visit on 05/30/22  Microscopic Examination   Urine  Result Value Ref Range   WBC, UA 0-5 0 - 5 /hpf   RBC, Urine 0-2 0 - 2 /hpf   Epithelial Cells (non renal) 0-10 0 - 10 /hpf   Mucus, UA Present (A) Not Estab.   Bacteria, UA None seen None seen/Few  Urinalysis, Routine w reflex microscopic  Result Value Ref Range   Specific Gravity, UA >1.030 (H) 1.005 - 1.030   pH, UA 5.0 5.0 - 7.5   Color, UA Yellow Yellow   Appearance Ur Clear Clear   Leukocytes,UA Negative Negative   Protein,UA Negative Negative/Trace   Glucose, UA Negative Negative   Ketones, UA Negative Negative   RBC, UA Trace (A) Negative   Bilirubin, UA Negative Negative    Urobilinogen, Ur 0.2 0.2 - 1.0 mg/dL   Nitrite, UA Negative Negative   Microscopic Examination See below:     Assessment & Plan      No follow-ups on file.      Problem List Items Addressed This Visit   None Visit Diagnoses     Dysuria    -  Primary Acute, recurrent concern Patient reports they have had dysuria, increased urinary frequency and some urinary hesitancy for the past 5 days that is not improving UA did not show signs of leukocytes or bacteria  but was positive for RBC and mucus- will send for culture for definitive rule out and wait for results before sending in abx  Will provide Pyridium 200 mg PO TID PRN to assist with urinary discomfort Recommend they stay well hydrated and avoid holding urine for prolonged periods Cannot definitively rule out kidney stone so may need to have CT renal or KUB if symptoms persist Follow up as needed for persistent or progressing symptoms     Relevant Medications   phenazopyridine (PYRIDIUM) 200 MG tablet   Other Relevant Orders   Urinalysis, Routine w reflex microscopic (Completed)   Microscopic Examination (Completed)   Urine Culture        No follow-ups on file.   I, Jery Hollern E Maryln Eastham, PA-C, have reviewed all documentation for this visit. The documentation on 05/30/22 for the exam, diagnosis, procedures, and orders are all accurate and complete.   Jacquelin Hawking, MHS, PA-C [facility]

## 2022-05-31 ENCOUNTER — Encounter: Payer: Self-pay | Admitting: Nurse Practitioner

## 2022-05-31 ENCOUNTER — Telehealth: Payer: Self-pay | Admitting: Physician Assistant

## 2022-05-31 ENCOUNTER — Ambulatory Visit (INDEPENDENT_AMBULATORY_CARE_PROVIDER_SITE_OTHER): Payer: 59 | Admitting: Nurse Practitioner

## 2022-05-31 VITALS — BP 108/74 | HR 83 | Temp 98.0°F | Ht 63.8 in | Wt 196.2 lb

## 2022-05-31 DIAGNOSIS — Z114 Encounter for screening for human immunodeficiency virus [HIV]: Secondary | ICD-10-CM

## 2022-05-31 DIAGNOSIS — E6609 Other obesity due to excess calories: Secondary | ICD-10-CM

## 2022-05-31 DIAGNOSIS — Z1159 Encounter for screening for other viral diseases: Secondary | ICD-10-CM

## 2022-05-31 DIAGNOSIS — E7800 Pure hypercholesterolemia, unspecified: Secondary | ICD-10-CM

## 2022-05-31 DIAGNOSIS — R300 Dysuria: Secondary | ICD-10-CM | POA: Diagnosis not present

## 2022-05-31 DIAGNOSIS — Z6834 Body mass index (BMI) 34.0-34.9, adult: Secondary | ICD-10-CM

## 2022-05-31 DIAGNOSIS — E559 Vitamin D deficiency, unspecified: Secondary | ICD-10-CM

## 2022-05-31 DIAGNOSIS — Z0000 Encounter for general adult medical examination without abnormal findings: Secondary | ICD-10-CM

## 2022-05-31 LAB — WET PREP FOR TRICH, YEAST, CLUE
Clue Cell Exam: NEGATIVE
Trichomonas Exam: NEGATIVE
Yeast Exam: NEGATIVE

## 2022-05-31 NOTE — Telephone Encounter (Signed)
Message sent to patient regarding urine sample

## 2022-05-31 NOTE — Assessment & Plan Note (Signed)
BMI 33.89.  Recommended eating smaller high protein, low fat meals more frequently and exercising 30 mins a day 5 times a week with a goal of 10-15lb weight loss in the next 3 months. Patient voiced their understanding and motivation to adhere to these recommendations.  

## 2022-05-31 NOTE — Assessment & Plan Note (Signed)
Noted on past labs -- recheck lipid panel today and continue diet focus.

## 2022-05-31 NOTE — Assessment & Plan Note (Signed)
Ongoing, noted past labs.  Continue supplement at home and check levels today.

## 2022-05-31 NOTE — Progress Notes (Signed)
BP 108/74   Pulse 83   Temp 98 F (36.7 C) (Oral)   Ht 5' 3.8" (1.621 m)   Wt 196 lb 3.2 oz (89 kg)   SpO2 99%   BMI 33.89 kg/m    Subjective:    Patient ID: Melanie Nordmann, adult    DOB: 1991-03-05, 31 y.o.   MRN: [id number]  HPI: Melanie Bailey is a 31 y.o. adult presenting on 05/31/2022 for comprehensive medical examination. Current medical complaints include:none  She currently lives with: room mates Menopausal Symptoms: no     05/31/2022    1:16 PM 04/06/2021   10:39 AM 03/08/2021   10:01 AM  Depression screen PHQ 2/9  Decreased Interest Down, Depressed, Hopeless PHQ - 2 Score Altered sleeping 1 0 0  Tired, decreased energy Change in appetite 0 0 0  Feeling bad or failure about yourself  0 1 1  Trouble concentrating 2 2 0  Moving slowly or fidgety/restless 1 0 0  Suicidal thoughts 0 0 0  PHQ-9 Score Difficult doing work/chores Somewhat difficult  Somewhat difficult       05/31/2022    1:16 PM 03/08/2021   10:03 AM  GAD 7 : Generalized Anxiety Score  Nervous, Anxious, on Edge 1 0  Control/stop worrying 1 1  Worry too much - different things 2 1  Trouble relaxing 2 0  Restless 1 0  Easily annoyed or irritable 1 1  Afraid - awful might happen 0 0  Total GAD 7 Score 8 3  Anxiety Difficulty Somewhat difficult Somewhat difficult   The patient does not have a history of falls. I did not complete a risk assessment for falls. A plan of care for falls was not documented.   Past Medical History:  History reviewed. No pertinent past medical history.  Surgical History:  Past Surgical History:  Procedure Laterality Date   APPENDECTOMY      Medications:  Current Outpatient Medications on File Prior to Visit  Medication Sig   cholecalciferol (VITAMIN D3) 25 MCG (1000 UNIT) tablet Take 1,000 Units by mouth daily.   Cranberry 50 MG CHEW Chew by mouth.   FIBER ADULT GUMMIES PO Take 2 tablets by mouth daily.    phenazopyridine (PYRIDIUM) 200 MG tablet Take 1 tablet (200 mg total) by mouth 3 (three) times daily as needed for pain.   No current facility-administered medications on file prior to visit.    Allergies:  No Known Allergies  Social History:  Social History   Socioeconomic History   Marital status: Single    Spouse name: Not on file   Number of children: Not on file   Years of education: Not on file   Highest education level: Not on file  Occupational History   Not on file  Tobacco Use   Smoking status: Never   Smokeless tobacco: Never  Vaping Use   Vaping Use: Never used  Substance and Sexual Activity   Alcohol use: Yes    Comment: socially   Drug use: Never   Sexual activity: Not Currently  Other Topics Concern   Not on file  Social History Narrative   Not on file   Social Determinants of Health   Financial Resource Strain: Low Risk  (03/08/2021)   Overall Financial Resource Strain (CARDIA)    Difficulty of Paying Living Expenses: Not hard at all  Food Insecurity: No Food Insecurity (03/08/2021)   Hunger Vital Sign    Worried About Running Out of Food in the Last Year: Never true    Ran Out of Food in the Last Year: Never true  Transportation Needs: No Transportation Needs (03/08/2021)   PRAPARE - Administrator, Civil Service (Medical): No    Lack of Transportation (Non-Medical): No  Physical Activity: Inactive (03/08/2021)   Exercise Vital Sign    Days of Exercise per Week: 0 days    Minutes of Exercise per Session: 0 min  Stress: Stress Concern Present (03/08/2021)   [employer] of Occupational Health - Occupational Stress Questionnaire    Feeling of Stress : To some extent  Social Connections: Socially Isolated (03/08/2021)   Social Connection and Isolation Panel [NHANES]    Frequency of Communication with Friends and Family: More than three times a week    Frequency of Social Gatherings with Friends and Family: More than three times a week     Attends Religious Services: Never    Database administrator or Organizations: No    Attends Banker Meetings: Never    Marital Status: Never married  Intimate Partner Violence: Not At Risk (03/08/2021)   Humiliation, Afraid, Rape, and Kick questionnaire    Fear of Current or Ex-Partner: No    Emotionally Abused: No    Physically Abused: No    Sexually Abused: No   Social History   Tobacco Use  Smoking Status Never  Smokeless Tobacco Never   Social History   Substance and Sexual Activity  Alcohol Use Yes   Comment: socially    Family History:  Family History  Problem Relation Age of Onset   Multiple sclerosis Mother     Past medical history, surgical history, medications, allergies, family history and social history reviewed with patient today and changes made to appropriate areas of the chart.   ROS All other ROS negative except what is listed above and in the HPI.      Objective:    BP 108/74   Pulse 83   Temp 98 F (36.7 C) (Oral)   Ht 5' 3.8" (1.621 m)   Wt 196 lb 3.2 oz (89 kg)   SpO2 99%   BMI 33.89 kg/m   Wt Readings from Last 3 Encounters:  05/31/22 196 lb 3.2 oz (89 kg)  05/30/22 201 lb (91.2 kg)  05/02/21 201 lb (91.2 kg)    Physical Exam Vitals and nursing note reviewed. Exam conducted with a chaperone present.  Constitutional:      General: Melanie Bailey is awake. Melanie Bailey is not in acute distress.    Appearance: Melanie Bailey is well-developed and well-groomed. Melanie Bailey is obese. Melanie Bailey is not ill-appearing or toxic-appearing.  HENT:     Head: Normocephalic and atraumatic.     Right Ear: Hearing, tympanic membrane, ear canal and external ear normal. No drainage.     Left Ear: Hearing, tympanic membrane, ear canal and external ear normal. No drainage.     Nose: Nose normal.     Right Sinus: No maxillary sinus tenderness or frontal sinus tenderness.     Left Sinus: No maxillary sinus tenderness or  frontal sinus tenderness.     Mouth/Throat:     Mouth: Mucous membranes are moist.     Pharynx: Oropharynx is clear. Uvula midline. No pharyngeal swelling, oropharyngeal exudate or posterior oropharyngeal erythema.  Eyes:  General: Lids are normal.        Right eye: No discharge.        Left eye: No discharge.     Extraocular Movements: Extraocular movements intact.     Conjunctiva/sclera: Conjunctivae normal.     Pupils: Pupils are equal, round, and reactive to light.     Visual Fields: Right eye visual fields normal and left eye visual fields normal.  Neck:     Thyroid: No thyromegaly.     Vascular: No carotid bruit.     Trachea: Trachea normal.  Cardiovascular:     Rate and Rhythm: Normal rate and regular rhythm.     Heart sounds: Normal heart sounds. No murmur heard.    No gallop.  Pulmonary:     Effort: Pulmonary effort is normal. No accessory muscle usage or respiratory distress.     Breath sounds: Normal breath sounds.  Abdominal:     General: Bowel sounds are normal.     Palpations: Abdomen is soft. There is no hepatomegaly or splenomegaly.     Tenderness: There is no abdominal tenderness.  Musculoskeletal:        General: Normal range of motion.     Cervical back: Normal range of motion and neck supple.     Right lower leg: No edema.     Left lower leg: No edema.  Lymphadenopathy:     Head:     Right side of head: No submental, submandibular, tonsillar, preauricular or posterior auricular adenopathy.     Left side of head: No submental, submandibular, tonsillar, preauricular or posterior auricular adenopathy.     Cervical: No cervical adenopathy.  Skin:    General: Skin is warm and dry.     Capillary Refill: Capillary refill takes less than 2 seconds.     Findings: No rash.  Neurological:     Mental Status: Melanie Bailey is alert and oriented to person, place, and time.     Gait: Gait is intact.     Deep Tendon Reflexes: Reflexes are normal and symmetric.      Reflex Scores:      Brachioradialis reflexes are 2+ on the right side and 2+ on the left side.      Patellar reflexes are 2+ on the right side and 2+ on the left side. Psychiatric:        Attention and Perception: Attention normal.        Mood and Affect: Mood normal.        Speech: Speech normal.        Behavior: Behavior normal. Behavior is cooperative.        Thought Content: Thought content normal.        Judgment: Judgment normal.    Results for orders placed or performed in visit on 05/30/22  Microscopic Examination   Urine  Result Value Ref Range   WBC, UA 0-5 0 - 5 /hpf   RBC, Urine 0-2 0 - 2 /hpf   Epithelial Cells (non renal) 0-10 0 - 10 /hpf   Mucus, UA Present (A) Not Estab.   Bacteria, UA None seen None seen/Few  Urinalysis, Routine w reflex microscopic  Result Value Ref Range   Specific Gravity, UA >1.030 (H) 1.005 - 1.030   pH, UA 5.0 5.0 - 7.5   Color, UA Yellow Yellow   Appearance Ur Clear Clear   Leukocytes,UA Negative Negative   Protein,UA Negative Negative/Trace   Glucose, UA Negative Negative   Ketones, UA Negative  Negative   RBC, UA Trace (A) Negative   Bilirubin, UA Negative Negative   Urobilinogen, Ur 0.2 0.2 - 1.0 mg/dL   Nitrite, UA Negative Negative   Microscopic Examination See below:       Assessment & Plan:   Problem List Items Addressed This Visit       Other   Elevated low density lipoprotein (LDL) cholesterol level - Primary    Noted on past labs -- recheck lipid panel today and continue diet focus.      Relevant Orders   Comprehensive metabolic panel   Lipid Panel w/o Chol/HDL Ratio   Obesity    BMI 33.89.  Recommended eating smaller high protein, low fat meals more frequently and exercising 30 mins a day 5 times a week with a goal of 10-15lb weight loss in the next 3 months. Patient voiced their understanding and motivation to adhere to these recommendations.       Vitamin D deficiency    Ongoing, noted past labs.   Continue supplement at home and check levels today.      Relevant Orders   VITAMIN D 25 Hydroxy (Vit-D Deficiency, Fractures)   Other Visit Diagnoses     Dysuria       Not enough urine for culture yesterday -- new urine today and obtain wet prep.  Treat as needed.   Relevant Orders   Urine Culture   WET PREP FOR TRICH, YEAST, CLUE   Need for hepatitis C screening test       Hep C screen on labs today per guidelines for one time screening, discussed with patient.   Relevant Orders   Hepatitis C antibody   Encounter for screening for HIV       HIV screen on labs today per guidelines for one time screening, discussed with patient.   Relevant Orders   HIV Antibody (routine testing w rflx)   Encounter for annual physical exam       Annual physical today with labs and health maintenance reviewed, discussed with patient.   Relevant Orders   CBC with Differential/Platelet   TSH        Follow up plan: Return in about 1 year (around 06/01/2023) for Annual physical.   LABORATORY TESTING:  - Pap smear: up to date  IMMUNIZATIONS:   - Tdap: Tetanus vaccination status reviewed: refused. - Influenza: Refused - Pneumovax: Not applicable - Prevnar: Not applicable - COVID: Up to date - HPV: Not applicable - Shingrix vaccine: Not applicable  SCREENING: -Mammogram: Not applicable  - Colonoscopy: Not applicable  - Bone Density: Not applicable  -Hearing Test: Not applicable  -Spirometry: Not applicable   PATIENT COUNSELING:   Advised to take 1 mg of folate supplement per day if capable of pregnancy.   Sexuality: Discussed sexually transmitted diseases, partner selection, use of condoms, avoidance of unintended pregnancy  and contraceptive alternatives.   Advised to avoid cigarette smoking.  I discussed with the patient that most people either abstain from alcohol or drink within safe limits (<=14/week and <=4 drinks/occasion for males, <=7/weeks and <= 3 drinks/occasion for  females) and that the risk for alcohol disorders and other health effects rises proportionally with the number of drinks per week and how often a drinker exceeds daily limits.  Discussed cessation/primary prevention of drug use and availability of treatment for abuse.   Diet: Encouraged to adjust caloric intake to maintain  or achieve ideal body weight, to reduce intake of dietary saturated fat and total  fat, to limit sodium intake by avoiding high sodium foods and not adding table salt, and to maintain adequate dietary potassium and calcium preferably from fresh fruits, vegetables, and low-fat dairy products.    Stressed the importance of regular exercise  Injury prevention: Discussed safety belts, safety helmets, smoke detector, smoking near bedding or upholstery.   Dental health: Discussed importance of regular tooth brushing, flossing, and dental visits.    NEXT PREVENTATIVE PHYSICAL DUE IN 1 YEAR. Return in about 1 year (around 06/01/2023) for Annual physical.

## 2022-05-31 NOTE — Progress Notes (Deleted)
Established Patient Office Visit  Name: Melanie Bailey   MRN: [id number]    DOB: 31-Jan-1991   Date:05/31/2022  Today's Provider: Jacquelin Hawking, MHS, PA-C Introduced myself to the patient as a PA-C and provided education on APPs in clinical practice.         Subjective  Chief Complaint  No chief complaint on file.   HPI   Patient Active Problem List   Diagnosis Date Noted   Elevated low density lipoprotein (LDL) cholesterol level 05/28/2022   Vitamin D deficiency 05/28/2022   Obesity 03/08/2021    Past Surgical History:  Procedure Laterality Date   APPENDECTOMY      Family History  Problem Relation Age of Onset   Multiple sclerosis Mother     Social History   Tobacco Use   Smoking status: Never   Smokeless tobacco: Never  Substance Use Topics   Alcohol use: Yes    Comment: socially     Current Outpatient Medications:    cholecalciferol (VITAMIN D3) 25 MCG (1000 UNIT) tablet, Take 1,000 Units by mouth daily., Disp: , Rfl:    Cranberry 50 MG CHEW, Chew by mouth., Disp: , Rfl:    FIBER ADULT GUMMIES PO, Take 2 tablets by mouth daily., Disp: , Rfl:    phenazopyridine (PYRIDIUM) 200 MG tablet, Take 1 tablet (200 mg total) by mouth 3 (three) times daily as needed for pain., Disp: 10 tablet, Rfl: 0  No Known Allergies  I personally reviewed {Reviewed:[id number]} with the patient/caregiver today.   ROS    Objective  There were no vitals filed for this visit.  There is no height or weight on file to calculate BMI.  Physical Exam   Recent Results (from the past 2160 hour(s))  Urinalysis, Routine w reflex microscopic     Status: Abnormal   Collection Time: 05/30/22  1:25 PM  Result Value Ref Range   Specific Gravity, UA >1.030 (H) 1.005 - 1.030   pH, UA 5.0 5.0 - 7.5   Color, UA Yellow Yellow   Appearance Ur Clear Clear   Leukocytes,UA Negative Negative   Protein,UA Negative Negative/Trace   Glucose, UA Negative Negative   Ketones, UA  Negative Negative   RBC, UA Trace (A) Negative   Bilirubin, UA Negative Negative   Urobilinogen, Ur 0.2 0.2 - 1.0 mg/dL   Nitrite, UA Negative Negative   Microscopic Examination See below:   Microscopic Examination     Status: Abnormal   Collection Time: 05/30/22  1:25 PM   Urine  Result Value Ref Range   WBC, UA 0-5 0 - 5 /hpf   RBC, Urine 0-2 0 - 2 /hpf   Epithelial Cells (non renal) 0-10 0 - 10 /hpf   Mucus, UA Present (A) Not Estab.   Bacteria, UA None seen None seen/Few     PHQ2/9:    04/06/2021   10:39 AM 03/08/2021   10:01 AM  Depression screen PHQ 2/9  Decreased Interest 2 1  Down, Depressed, Hopeless 2 1  PHQ - 2 Score 4 2  Altered sleeping 0 0  Tired, decreased energy 1 1  Change in appetite 0 0  Feeling bad or failure about yourself  1 1  Trouble concentrating 2 0  Moving slowly or fidgety/restless 0 0  Suicidal thoughts 0 0  PHQ-9 Score 8 4  Difficult doing work/chores  Somewhat difficult      Fall Risk:    04/12/2021  2:33 PM 04/06/2021   10:39 AM 03/08/2021   10:02 AM  Fall Risk   Falls in the past year? 0 0 0  Number falls in past yr: 0 0 0  Injury with Fall? 0 0 0  Risk for fall due to : No Fall Risks No Fall Risks No Fall Risks  Follow up Education provided Falls evaluation completed Falls evaluation completed      Functional Status Survey:      Assessment & Plan

## 2022-05-31 NOTE — Patient Instructions (Signed)

## 2022-05-31 NOTE — Progress Notes (Signed)
Contacted via MyChart   Wet prep was negative, we will see what urine culture shows:)

## 2022-06-01 LAB — CBC WITH DIFFERENTIAL/PLATELET
Basophils Absolute: 0 10*3/uL (ref 0.0–0.2)
Basos: 0 %
EOS (ABSOLUTE): 0 10*3/uL (ref 0.0–0.4)
Eos: 0 %
Hematocrit: 40.1 % (ref 34.0–46.6)
Hemoglobin: 13.2 g/dL (ref 11.1–15.9)
Immature Grans (Abs): 0 10*3/uL (ref 0.0–0.1)
Immature Granulocytes: 0 %
Lymphocytes Absolute: 1.8 10*3/uL (ref 0.7–3.1)
Lymphs: 25 %
MCH: 30.8 pg (ref 26.6–33.0)
MCHC: 32.9 g/dL (ref 31.5–35.7)
MCV: 94 fL (ref 79–97)
Monocytes Absolute: 0.5 10*3/uL (ref 0.1–0.9)
Monocytes: 7 %
Neutrophils Absolute: 4.8 10*3/uL (ref 1.4–7.0)
Neutrophils: 68 %
Platelets: 311 10*3/uL (ref 150–450)
RBC: 4.29 x10E6/uL (ref 3.77–5.28)
RDW: 11.8 % (ref 11.7–15.4)
WBC: 7.1 10*3/uL (ref 3.4–10.8)

## 2022-06-01 LAB — COMPREHENSIVE METABOLIC PANEL
ALT: 12 IU/L (ref 0–32)
AST: 18 IU/L (ref 0–40)
Albumin/Globulin Ratio: 1.5 (ref 1.2–2.2)
Albumin: 4.4 g/dL (ref 3.9–4.9)
Alkaline Phosphatase: 96 IU/L (ref 44–121)
BUN/Creatinine Ratio: 13 (ref 9–23)
BUN: 8 mg/dL (ref 6–20)
Bilirubin Total: 0.3 mg/dL (ref 0.0–1.2)
CO2: 25 mmol/L (ref 20–29)
Calcium: 9.3 mg/dL (ref 8.7–10.2)
Chloride: 101 mmol/L (ref 96–106)
Creatinine, Ser: 0.63 mg/dL (ref 0.57–1.00)
Globulin, Total: 2.9 g/dL (ref 1.5–4.5)
Glucose: 92 mg/dL (ref 70–99)
Potassium: 3.8 mmol/L (ref 3.5–5.2)
Sodium: 140 mmol/L (ref 134–144)
Total Protein: 7.3 g/dL (ref 6.0–8.5)
eGFR: 121 mL/min/{1.73_m2} (ref >59)

## 2022-06-01 LAB — LIPID PANEL W/O CHOL/HDL RATIO
Cholesterol, Total: 173 mg/dL (ref 100–199)
HDL: 42 mg/dL (ref >39)
LDL Chol Calc (NIH): 117 mg/dL — ABNORMAL HIGH (ref 0–99)
Triglycerides: 74 mg/dL (ref 0–149)
VLDL Cholesterol Cal: 14 mg/dL (ref 5–40)

## 2022-06-01 LAB — HIV ANTIBODY (ROUTINE TESTING W REFLEX): HIV Screen 4th Generation wRfx: NONREACTIVE

## 2022-06-01 LAB — HEPATITIS C ANTIBODY: Hep C Virus Ab: NONREACTIVE

## 2022-06-01 LAB — TSH: TSH: 1.17 u[IU]/mL (ref 0.450–4.500)

## 2022-06-01 LAB — VITAMIN D 25 HYDROXY (VIT D DEFICIENCY, FRACTURES): Vit D, 25-Hydroxy: 31.5 ng/mL (ref 30.0–100.0)

## 2022-06-01 NOTE — Progress Notes (Signed)
Contacted via MyChart   Good afternoon Melanie Bailey, your labs have returned and overall look fantastic with exception of LDL.  Your LDL is above normal. The LDL is the bad cholesterol. Over time and in combination with inflammation and other factors, this contributes to plaque which in turn may lead to stroke and/or heart attack down the road. Sometimes high LDL is primarily genetic, and people might be eating all the right foods but still have high numbers. Other times, there is room for improvement in one's diet and eating healthier can bring this number down and potentially reduce one's risk of heart attack and/or stroke.   To reduce your LDL, Remember - more fruits and vegetables, more fish, and limit red meat and dairy products. More soy, nuts, beans, barley, lentils, oats and plant sterol ester enriched margarine instead of butter. I also encourage eliminating sugar and processed food. Remember, shop on the outside of the grocery store and visit your [employer]. If you would like to talk with me about dietary changes for your cholesterol, please let me know. We should recheck your cholesterol in 12 months.  Any questions? Keep being wonderful!!  Thank you for allowing me to participate in your care.  I appreciate you. Kindest regards, Melanie Bailey

## 2022-06-03 LAB — URINE CULTURE

## 2022-06-04 ENCOUNTER — Other Ambulatory Visit: Payer: Self-pay | Admitting: Nurse Practitioner

## 2022-06-04 MED ORDER — AMOXICILLIN-POT CLAVULANATE 875-125 MG PO TABS: 1.0000 | ORAL_TABLET | Freq: Two times a day (BID) | ORAL | 0 refills | Status: AC

## 2022-06-04 NOTE — Progress Notes (Signed)
Contacted via MyChart   Good morning Melanie Bailey, looks like your urine is showing some growth in it.  It is not >100,000 where we often treat, however since you were still having symptoms it could be that it is just starting or on way out -- I have sent in Augmentin for you to take for 5 days to be on safe side since it is susceptible to this.  Any questions? Keep being amazing!!  Thank you for allowing me to participate in your care.  I appreciate you. Kindest regards, Javier Mamone

## 2022-08-21 ENCOUNTER — Encounter: Payer: Self-pay | Admitting: Nurse Practitioner

## 2022-08-21 ENCOUNTER — Ambulatory Visit (INDEPENDENT_AMBULATORY_CARE_PROVIDER_SITE_OTHER): Payer: 59 | Admitting: Nurse Practitioner

## 2022-08-21 ENCOUNTER — Ambulatory Visit: Payer: Self-pay | Admitting: *Deleted

## 2022-08-21 VITALS — BP 104/75 | HR 121 | Temp 98.0°F | Ht 63.82 in | Wt 191.4 lb

## 2022-08-21 DIAGNOSIS — K529 Noninfective gastroenteritis and colitis, unspecified: Secondary | ICD-10-CM | POA: Insufficient documentation

## 2022-08-21 MED ORDER — ONDANSETRON HCL 4 MG PO TABS: 4.0000 mg | ORAL_TABLET | Freq: Three times a day (TID) | ORAL | 0 refills | Status: DC | PRN

## 2022-08-21 NOTE — Telephone Encounter (Signed)
Pt called in with c/o body aches, no appetite, nausea.   No appts. With [facility] today.    Seeking advice.   Reason for Disposition  Nausea lasts > 1 week    Not able to eat and a slight headache.   The nausea has restarted this morning.  Answer Assessment - Initial Assessment Questions 1. NAUSEA SEVERITY: "How bad is the nausea?" (e.g., mild, moderate, severe; dehydration, weight loss)   - MILD: loss of appetite without change in eating habits   - MODERATE: decreased oral intake without significant weight loss, dehydration, or malnutrition   - SEVERE: inadequate caloric or fluid intake, significant weight loss, symptoms of dehydration     Last Thur. The nausea and body aches started.    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    No diarrhea 2. ONSET: "When did the nausea begin?"     Last Thur.  It started.   I took Tums Thur. When got home from work.    I took Nyquil to help me sleep.    Headache since Thur. Today it's mostly gone.  But still mild.   I can't eat much.   No appetite.    I'm drinking water as much as I can.     I've been drinking Pedialyte and that is helping.    Today having more nausea.    3. VOMITING: "Any vomiting?" If Yes, ask: "How many times today?"     No vomiting just very nauseas.  I gagged Sat. Morning and this morning but no vomiting.    I have not tried any Pepto Bismol.      4. RECURRENT SYMPTOM: "Have you had nausea before?" If Yes, ask: "When was the last time?" "What happened that time?"     No 5. CAUSE: "What do you think is causing the nausea?"     Fever 101.2 once but high 99-100 mostly.     I had body aches the first couple of days.   I was sweating even without a fever.   The body aches are gone. Yesterday I started having an appetite and feeling better.   6. PREGNANCY: "Is there any chance you are pregnant?" (e.g., unprotected intercourse, missed birth control pill, broken condom)     Not asked  Protocols used:  Nausea-A-AH

## 2022-08-21 NOTE — Telephone Encounter (Signed)
  Chief Complaint: nausea, poor appetite, mild headache. Symptoms: above Frequency: Symptoms started last Thur. Afternoon with the nausea.   Pertinent Negatives: Patient denies having body aches and fever now but did in the beginning.   No vomiting or diarrhea.   Disposition: []$ ED /[]$ Urgent Care (no appt availability in office) / [x]$ Appointment(In office/virtual)/ []$  [facility]/ []$ Home Care/ []$ Refused Recommended Disposition /[]$ [facility]/ []$  Follow-up with PCP Additional Notes: Appt. Made today with Melanie Guarneri, NP for this afternoon at her request so someone can bring her.

## 2022-08-21 NOTE — Patient Instructions (Signed)
Bland Diet A bland diet may consist of soft foods or foods that are not high in fat or are not greasy, acidic, or spicy. Avoiding certain foods may cause less irritation to your mouth, throat, stomach, or gastrointestinal tract. Avoiding certain foods may make you feel better. Everyone's tolerances are different. A bland diet should be based on what you can tolerate and what may cause discomfort. What is my plan? Your health care provider or dietitian may recommend specific changes to your diet to treat your symptoms. These changes may include: Eating small meals frequently. Cooking food until it is soft enough to chew easily. Taking the time to chew your food thoroughly, so it is easy to swallow and digest. Avoiding foods that cause you discomfort. These may include spicy food, fried food, greasy foods, hard-to-chew foods, or citrus fruits and juices. Drinking slowly. What are tips for following this plan? Reading food labels To reduce fiber intake, look for food labels that say "whole," such as whole wheat or whole grain. Shopping Avoid food items that may have nuts or seeds. Avoid vegetables that may make you gassy or have a tough texture, such as broccoli, cauliflower, or corn. Cooking Cook foods thoroughly so they have a soft texture. Meal planning Make sure you include foods from all food groups to eat a balanced diet. Eat a variety of types of foods. Eat foods and drink beverages that do not cause you discomfort. These may include soups and broths with cooked meats, pasta, and vegetables. Lifestyle Sit up after meals, avoid tight clothing, and take time to eat and chew your food slowly. Ask your health care provider whether you should take dietary supplements. General information Mildly season your foods. Some seasonings, such as cayenne pepper, vinegar, or hot sauce, may cause irritation. The foods, beverages, or seasonings to avoid should be based on individual tolerance. What  foods should I eat? Fruits Canned or cooked fruit such as peaches, pears, or applesauce. Bananas. Vegetables Well-cooked vegetables. Canned or cooked vegetables such as carrots, green beans, beets, or spinach. Mashed or boiled potatoes. Grains  Hot cereals, such as cream of wheat and processed oatmeal. Rice. Bread, crackers, pasta, or tortillas made from refined white flour. Meats and other proteins  Eggs. Creamy peanut butter or other nut butters. Lean, well-cooked tender meats, such as beef, pork, chicken, or fish. Dairy Low-fat dairy products such as milk, cottage cheese, or yogurt. Beverages  Water. Herbal tea. Apple juice. Fats and oils Mild salad dressings. Canola or olive oil. Sweets and desserts Low-fat pudding, custard, or ice cream. Fruit gelatin. The items listed above may not be a complete list of foods and beverages you can eat. Contact a dietitian for more information. What foods should I avoid? Fruits Citrus fruits, such as oranges and grapefruit. Fruits with a stringy texture. Fruits that have lots of seeds, such as kiwi or strawberries. Dried fruits. Vegetables Raw, uncooked vegetables. Salads. Grains Whole grain breads, muffins, and cereals. Meats and other proteins Tough, fibrous meats. Highly seasoned meat such as corned beef, smoked meats, or fish. Processed high-fat meats such as brats, hot dogs, or sausage. Dairy Full-fat dairy foods such as ice cream and cheese. Beverages Caffeinated drinks. Alcohol. Seasonings and condiments Strongly flavored seasonings or condiments. Hot sauce. Salsa. Other foods Spicy foods. Fried or greasy foods. Sour foods, such as pickled or fermented foods like sauerkraut. Foods high in fiber. The items listed above may not be a complete list of foods and beverages you should   avoid. Contact a dietitian for more information. Summary A bland diet should be based on individual tolerance. It may consist of foods that are soft  textured and do not have a lot of fat, fiber, acid, or seasonings. A bland diet may be recommended because avoiding certain foods, beverages, or spices may make you feel better. This information is not intended to replace advice given to you by your health care provider. Make sure you discuss any questions you have with your health care provider. Document Revised: 05/09/2021 Document Reviewed: 05/09/2021 Elsevier Patient Education  2023 [employer] Inc.  

## 2022-08-21 NOTE — Assessment & Plan Note (Signed)
Acute and slowly improving.  Recommend BLAND diet at home and will send in Zofran for them to take as needed.  Discussed nature of gastroenteritis and length of time to recovery.  Discussed with them until feeling better would avoid any heavy food or fast food.  Work note provided.

## 2022-08-21 NOTE — Progress Notes (Signed)
BP 104/75   Pulse (!) 121   Temp 98 F (36.7 C) (Oral)   Ht 5' 3.82" (1.621 m)   Wt 191 lb 6.4 oz (86.8 kg)   SpO2 99%   BMI 33.04 kg/m    Subjective:    Patient ID: Melanie Bailey, adult    DOB: [birth year], 32 y.o.   MRN: [id number]  HPI: Melanie Bailey is a 32 y.o. adult  Chief Complaint  Patient presents with   Headache    Body aches, nausea, no appetite since last Thursday   UPPER RESPIRATORY TRACT INFECTION Had a fever for about 3 days and no appetite starting last Thursday.  Tried to go to work this morning, but could not was still very nauseous.  Symptoms included fever and nausea.   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 Interim medical history since our last visit reviewed. Allergies and medications reviewed and updated.  Review of Systems  Constitutional:  Positive for fatigue and fever (not anymore). Negative for activity change and appetite change.  Eyes:  Negative for pain and visual disturbance.  Respiratory:  Negative for cough, chest tightness, shortness of breath and wheezing.   Cardiovascular:  Negative for chest pain, palpitations and leg swelling.  Gastrointestinal:  Positive for nausea. Negative for abdominal distention, abdominal pain, constipation, diarrhea and  vomiting.  Endocrine: Negative.   Musculoskeletal:  Positive for myalgias.  Neurological:  Positive for headaches. Negative for dizziness and numbness.  Psychiatric/Behavioral: Negative.      Per HPI unless specifically indicated above     Objective:    BP 104/75   Pulse (!) 121   Temp 98 F (36.7 C) (Oral)   Ht 5' 3.82" (1.621 m)   Wt 191 lb 6.4 oz (86.8 kg)   SpO2 99%   BMI 33.04 kg/m   Wt Readings from Last 3 Encounters:  08/21/22 191 lb 6.4 oz (86.8 kg)  05/31/22 196 lb 3.2 oz (89 kg)  05/30/22 201 lb (91.2 kg)    Physical Exam Vitals and nursing note reviewed.  Constitutional:      General: Melanie Bailey is awake. Melanie Bailey is not in acute distress.    Appearance: Melanie Bailey is well-developed and well-groomed. Melanie Bailey is obese. In Melanie Bailey is not ill-appearing or toxic-appearing.  HENT:     Head: Normocephalic.     Right Ear: Hearing, tympanic membrane, ear canal and external ear normal.     Left Ear: Hearing, tympanic membrane, ear canal and external ear normal.     Nose: Nose normal.     Right Sinus: No maxillary sinus tenderness or frontal sinus tenderness.     Left Sinus: No maxillary sinus tenderness or frontal sinus tenderness.     Mouth/Throat:     Mouth: Mucous membranes are moist.     Pharynx: No pharyngeal swelling, oropharyngeal  exudate or posterior oropharyngeal erythema.  Eyes:     General: Lids are normal.        Right eye: No discharge.        Left eye: No discharge.     Conjunctiva/sclera: Conjunctivae normal.     Pupils: Pupils are equal, round, and reactive to light.  Neck:     Thyroid: No thyromegaly.     Vascular: No carotid bruit or JVD.  Cardiovascular:     Rate and Rhythm: Normal rate and regular rhythm.     Heart sounds: Normal heart sounds. No murmur heard.    No gallop.  Pulmonary:     Effort: Pulmonary effort is normal.     Breath sounds: Normal breath sounds.  Abdominal:     General: Bowel  sounds are normal. There is no distension.     Palpations: Abdomen is soft.     Tenderness: There is no abdominal tenderness.  Musculoskeletal:     Cervical back: Normal range of motion and neck supple.     Right lower leg: No edema.     Left lower leg: No edema.  Lymphadenopathy:     Cervical: No cervical adenopathy.  Skin:    General: Skin is warm and dry.  Neurological:     Mental Status: Melanie Bailey is alert and oriented to person, place, and time.  Psychiatric:        Attention and Perception: Attention normal.        Mood and Affect: Mood normal.        Speech: Speech normal.        Behavior: Behavior normal. Behavior is cooperative.        Thought Content: Thought content normal.    Results for orders placed or performed in visit on 05/31/22  Urine Culture   Specimen: Urine   UR  Result Value Ref Range   Urine Culture, Routine Final report (A)    Organism ID, Bacteria Escherichia coli (A)    Antimicrobial Susceptibility Comment   WET PREP FOR TRICH, YEAST, CLUE   Specimen: Sterile Swab   Sterile Swab  Result Value Ref Range   Trichomonas Exam Negative Negative   Yeast Exam Negative Negative   Clue Cell Exam Negative Negative  CBC with Differential/Platelet  Result Value Ref Range   WBC 7.1 3.4 - 10.8 x10E3/uL   RBC 4.29 3.77 - 5.28 x10E6/uL   Hemoglobin 13.2 11.1 - 15.9 g/dL   Hematocrit 40.1 34.0 - 46.6 %   MCV 94 79 - 97 fL   MCH 30.8 26.6 - 33.0 pg   MCHC 32.9 31.5 - 35.7 g/dL   RDW 11.8 11.7 - 15.4 %   Platelets 311 150 - 450 x10E3/uL   Neutrophils 68 Not Estab. %   Lymphs 25 Not Estab. %   Monocytes 7 Not Estab. %   Eos 0 Not Estab. %   Basos 0 Not Estab. %   Neutrophils Absolute 4.8 1.4 - 7.0 x10E3/uL   Lymphocytes Absolute 1.8 0.7 - 3.1 x10E3/uL   Monocytes Absolute 0.5 0.1 - 0.9 x10E3/uL   EOS (ABSOLUTE) 0.0 0.0 - 0.4 x10E3/uL   Basophils Absolute 0.0 0.0 - 0.2 x10E3/uL   Immature Granulocytes 0 Not Estab. %   Immature Grans (Abs) 0.0 0.0  - 0.1 x10E3/uL  Comprehensive metabolic panel  Result Value Ref Range   Glucose 92 70 - 99 mg/dL   BUN 8 6 - 20 mg/dL   Creatinine, Ser 0.63 0.57 -  1.00 mg/dL   eGFR 121 >59 mL/min/1.73   BUN/Creatinine Ratio 13 9 - 23   Sodium 140 134 - 144 mmol/L   Potassium 3.8 3.5 - 5.2 mmol/L   Chloride 101 96 - 106 mmol/L   CO2 25 20 - 29 mmol/L   Calcium 9.3 8.7 - 10.2 mg/dL   Total Protein 7.3 6.0 - 8.5 g/dL   Albumin 4.4 3.9 - 4.9 g/dL   Globulin, Total 2.9 1.5 - 4.5 g/dL   Albumin/Globulin Ratio 1.5 1.2 - 2.2   Bilirubin Total 0.3 0.0 - 1.2 mg/dL   Alkaline Phosphatase 96 44 - 121 IU/L   AST 18 0 - 40 IU/L   ALT 12 0 - 32 IU/L  Lipid Panel w/o Chol/HDL Ratio  Result Value Ref Range   Cholesterol, Total 173 100 - 199 mg/dL   Triglycerides 74 0 - 149 mg/dL   HDL 42 >39 mg/dL   VLDL Cholesterol Cal 14 5 - 40 mg/dL   LDL Chol Calc (NIH) 117 (H) 0 - 99 mg/dL  TSH  Result Value Ref Range   TSH 1.170 0.450 - 4.500 uIU/mL  VITAMIN D 25 Hydroxy (Vit-D Deficiency, Fractures)  Result Value Ref Range   Vit D, 25-Hydroxy 31.5 30.0 - 100.0 ng/mL  Hepatitis C antibody  Result Value Ref Range   Hep C Virus Ab Non Reactive Non Reactive  HIV Antibody (routine testing w rflx)  Result Value Ref Range   HIV Screen 4th Generation wRfx Non Reactive Non Reactive      Assessment & Plan:   Problem List Items Addressed This Visit       Digestive   Gastroenteritis - Primary    Acute and slowly improving.  Recommend BLAND diet at home and will send in Zofran for them to take as needed.  Discussed nature of gastroenteritis and length of time to recovery.  Discussed with them until feeling better would avoid any heavy food or fast food.  Work note provided.        Follow up plan: Return if symptoms worsen or fail to improve.

## 2022-08-21 NOTE — Telephone Encounter (Signed)
The line disconnected right at the end of the call.   I called her back and let her know the temporary address in [place] that [facility] is now.  [address]  [address].

## 2023-03-02 ENCOUNTER — Telehealth: Payer: Self-pay | Admitting: Nurse Practitioner

## 2023-03-02 NOTE — Telephone Encounter (Signed)
Copied from CRM [id number]. Topic: General - Inquiry >> Mar 02, 2023  2:46 PM Patsy Lager T wrote: Reason for CRM: patient called to get a TB Screening test done for a job and she also needs a copy of her vaccinations. Please f/u with patient

## 2023-03-02 NOTE — Telephone Encounter (Signed)
Appt has been made.

## 2023-03-11 NOTE — Patient Instructions (Signed)

## 2023-03-14 ENCOUNTER — Encounter: Payer: Self-pay | Admitting: Nurse Practitioner

## 2023-03-14 ENCOUNTER — Ambulatory Visit: Payer: 59 | Admitting: Nurse Practitioner

## 2023-03-14 VITALS — BP 114/69 | HR 63 | Temp 98.3°F | Ht 63.9 in | Wt 196.8 lb

## 2023-03-14 DIAGNOSIS — E6609 Other obesity due to excess calories: Secondary | ICD-10-CM

## 2023-03-14 DIAGNOSIS — Z0289 Encounter for other administrative examinations: Secondary | ICD-10-CM | POA: Diagnosis not present

## 2023-03-14 DIAGNOSIS — Z6833 Body mass index (BMI) 33.0-33.9, adult: Secondary | ICD-10-CM | POA: Diagnosis not present

## 2023-03-14 NOTE — Assessment & Plan Note (Signed)
BMI 33.89.  Recommended eating smaller high protein, low fat meals more frequently and exercising 30 mins a day 5 times a week with a goal of 10-15lb weight loss in the next 3 months. Patient voiced their understanding and motivation to adhere to these recommendations.

## 2023-03-14 NOTE — Progress Notes (Signed)
BP 114/69   Pulse 63   Temp 98.3 F (36.8 C) (Oral)   Ht 5' 3.9" (1.623 m)   Wt 196 lb 12.8 oz (89.3 kg)   SpO2 100%   BMI 33.89 kg/m    Subjective:    Patient ID: Melanie Bailey, adult    DOB: 17-Oct-1990, 32 y.o.   MRN: [id number]  HPI: Melanie Bailey is a 32 y.o. 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  Currently cannot find immunization records, but will attempt to get them through her school or health department.  Her mother does not have them on hand and no record in Epic. Discussed with patient if unable to find we can perform titers in office outpatient.  Overall healthy patient, forms that could be signed off were and reviewed with patient.  Relevant past medical, surgical, family and social history reviewed and updated as indicated. Interim medical history since our last visit reviewed. Allergies and medications reviewed and updated.  Review of Systems  Constitutional:  Negative for activity change, appetite change, diaphoresis, fatigue and fever.  Respiratory:  Negative for cough, chest tightness and shortness of breath.   Cardiovascular:  Negative for chest pain, palpitations and leg swelling.  Gastrointestinal: Negative.   Neurological: Negative.   Psychiatric/Behavioral: Negative.      Per HPI unless specifically indicated above     Objective:    BP 114/69   Pulse 63   Temp 98.3 F (36.8 C) (Oral)   Ht 5' 3.9" (1.623 m)   Wt 196 lb 12.8 oz (89.3 kg)   SpO2 100%   BMI 33.89 kg/m   Wt Readings from Last 3 Encounters:  03/14/23 196 lb 12.8 oz (89.3 kg)  08/21/22 191 lb 6.4 oz (86.8 kg)  05/31/22 196 lb 3.2 oz (89 kg)    Physical Exam Vitals and nursing note reviewed.  Constitutional:      General: Kashayla is awake. Blaike is not in acute distress.    Appearance: Keigan is well-developed and well-groomed. Cidnee is obese. Druscilla  is not ill-appearing or toxic-appearing.  HENT:     Head: Normocephalic.     Right Ear: Hearing normal.     Left Ear: Hearing normal.  Eyes:     General: Lids are normal.        Right eye: No discharge.        Left eye: No discharge.     Conjunctiva/sclera: Conjunctivae normal.     Pupils: Pupils are equal, round, and reactive to light.  Neck:     Thyroid: No thyromegaly.     Vascular: No carotid bruit.  Cardiovascular:     Rate and Rhythm: Normal rate and regular rhythm.     Heart sounds: Normal heart sounds. No murmur heard.    No gallop.  Pulmonary:     Effort: Pulmonary effort is normal. No accessory muscle usage or respiratory distress.     Breath sounds: Normal breath sounds.  Abdominal:     General: Bowel sounds are normal.     Palpations: Abdomen is soft.  Musculoskeletal:     Cervical back: Normal range of motion and neck supple.     Right lower leg: No edema.     Left lower leg: No edema.  Lymphadenopathy:     Cervical: No cervical adenopathy.  Skin:    General: Skin is warm and  dry.  Neurological:     Mental Status: Demy is alert and oriented to person, place, and time.  Psychiatric:        Attention and Perception: Attention normal.        Mood and Affect: Mood normal.        Speech: Speech normal.        Behavior: Behavior normal. Behavior is cooperative.        Thought Content: Thought content normal.     Results for orders placed or performed in visit on 05/31/22  Urine Culture   Specimen: Urine   UR  Result Value Ref Range   Urine Culture, Routine Final report (A)    Organism ID, Bacteria Escherichia coli (A)    Antimicrobial Susceptibility Comment   WET PREP FOR TRICH, YEAST, CLUE   Specimen: Sterile Swab   Sterile Swab  Result Value Ref Range   Trichomonas Exam Negative Negative   Yeast Exam Negative Negative   Clue Cell Exam Negative Negative  CBC with Differential/Platelet  Result Value Ref Range   WBC 7.1 3.4 - 10.8 x10E3/uL   RBC  4.29 3.77 - 5.28 x10E6/uL   Hemoglobin 13.2 11.1 - 15.9 g/dL   Hematocrit 42.5 95.6 - 46.6 %   MCV 94 79 - 97 fL   MCH 30.8 26.6 - 33.0 pg   MCHC 32.9 31.5 - 35.7 g/dL   RDW 38.7 56.4 - 33.2 %   Platelets 311 150 - 450 x10E3/uL   Neutrophils 68 Not Estab. %   Lymphs 25 Not Estab. %   Monocytes 7 Not Estab. %   Eos 0 Not Estab. %   Basos 0 Not Estab. %   Neutrophils Absolute 4.8 1.4 - 7.0 x10E3/uL   Lymphocytes Absolute 1.8 0.7 - 3.1 x10E3/uL   Monocytes Absolute 0.5 0.1 - 0.9 x10E3/uL   EOS (ABSOLUTE) 0.0 0.0 - 0.4 x10E3/uL   Basophils Absolute 0.0 0.0 - 0.2 x10E3/uL   Immature Granulocytes 0 Not Estab. %   Immature Grans (Abs) 0.0 0.0 - 0.1 x10E3/uL  Comprehensive metabolic panel  Result Value Ref Range   Glucose 92 70 - 99 mg/dL   BUN 8 6 - 20 mg/dL   Creatinine, Ser 9.51 0.57 - 1.00 mg/dL   eGFR 884 >16 SA/YTK/1.60   BUN/Creatinine Ratio 13 9 - 23   Sodium 140 134 - 144 mmol/L   Potassium 3.8 3.5 - 5.2 mmol/L   Chloride 101 96 - 106 mmol/L   CO2 25 20 - 29 mmol/L   Calcium 9.3 8.7 - 10.2 mg/dL   Total Protein 7.3 6.0 - 8.5 g/dL   Albumin 4.4 3.9 - 4.9 g/dL   Globulin, Total 2.9 1.5 - 4.5 g/dL   Albumin/Globulin Ratio 1.5 1.2 - 2.2   Bilirubin Total 0.3 0.0 - 1.2 mg/dL   Alkaline Phosphatase 96 44 - 121 IU/L   AST 18 0 - 40 IU/L   ALT 12 0 - 32 IU/L  Lipid Panel w/o Chol/HDL Ratio  Result Value Ref Range   Cholesterol, Total 173 100 - 199 mg/dL   Triglycerides 74 0 - 149 mg/dL   HDL 42 >10 mg/dL   VLDL Cholesterol Cal 14 5 - 40 mg/dL   LDL Chol Calc (NIH) 932 (H) 0 - 99 mg/dL  TSH  Result Value Ref Range   TSH 1.170 0.450 - 4.500 uIU/mL  VITAMIN D 25 Hydroxy (Vit-D Deficiency, Fractures)  Result Value Ref Range   Vit D, 25-Hydroxy 31.5 30.0 -  100.0 ng/mL  Hepatitis C antibody  Result Value Ref Range   Hep C Virus Ab Non Reactive Non Reactive  HIV Antibody (routine testing w rflx)  Result Value Ref Range   HIV Screen 4th Generation wRfx Non Reactive Non  Reactive      Assessment & Plan:   Problem List Items Addressed This Visit       Other   Obesity - Primary    BMI 33.89.  Recommended eating smaller high protein, low fat meals more frequently and exercising 30 mins a day 5 times a week with a goal of 10-15lb weight loss in the next 3 months. Patient voiced their understanding and motivation to adhere to these recommendations.       Other Visit Diagnoses     Encounter for completion of form with patient       Forms completed with patient today, if unablet to find vaccine records will obtain titers.        Follow up plan: Return if symptoms worsen or fail to improve.

## 2023-06-01 NOTE — Patient Instructions (Signed)
Be Involved in Caring For Your Health:  Taking Medications When medications are taken as directed, they can greatly improve your health. But if they are not taken as prescribed, they may not work. In some cases, not taking them correctly can be harmful. To help ensure your treatment remains effective and safe, understand your medications and how to take them. Bring your medications to each visit for review by your provider.  Your lab results, notes, and after visit summary will be available on My Chart. We strongly encourage you to use this feature. If lab results are abnormal the clinic will contact you with the appropriate steps. If the clinic does not contact you assume the results are satisfactory. You can always view your results on My Chart. If you have questions regarding your health or results, please contact the clinic during office hours. You can also ask questions on My Chart.  We at [facility] are grateful that you chose [place] to provide your care. We strive to provide evidence-based and compassionate care and are always looking for feedback. If you get a survey from the clinic please complete this so we can hear your opinions.  Healthy Eating, Adult Healthy eating may help you get and keep a healthy body weight, reduce the risk of chronic disease, and live a long and productive life. It is important to follow a healthy eating pattern. Your nutritional and calorie needs should be met mainly by different nutrient-rich foods. What are tips for following this plan? Reading food labels Read labels and choose the following: Reduced or low sodium products. Juices with 100% fruit juice. Foods with low saturated fats (<3 g per serving) and high polyunsaturated and monounsaturated fats. Foods with whole grains, such as whole wheat, cracked wheat, brown rice, and wild rice. Whole grains that are fortified with folic acid. This is recommended for females who are pregnant or who want to  become pregnant. Read labels and do not eat or drink the following: Foods or drinks with added sugars. These include foods that contain brown sugar, corn sweetener, corn syrup, dextrose, fructose, glucose, high-fructose corn syrup, honey, invert sugar, lactose, malt syrup, maltose, molasses, raw sugar, sucrose, trehalose, or turbinado sugar. Limit your intake of added sugars to less than 10% of your total daily calories. Do not eat more than the following amounts of added sugar per day: 6 teaspoons (25 g) for females. 9 teaspoons (38 g) for males. Foods that contain processed or refined starches and grains. Refined grain products, such as white flour, degermed cornmeal, white bread, and white rice. Shopping Choose nutrient-rich snacks, such as vegetables, whole fruits, and nuts. Avoid high-calorie and high-sugar snacks, such as potato chips, fruit snacks, and candy. Use oil-based dressings and spreads on foods instead of solid fats such as butter, margarine, sour cream, or cream cheese. Limit pre-made sauces, mixes, and "instant" products such as flavored rice, instant noodles, and ready-made pasta. Try more plant-protein sources, such as tofu, tempeh, black beans, edamame, lentils, nuts, and seeds. Explore eating plans such as the Mediterranean diet or vegetarian diet. Try heart-healthy dips made with beans and healthy fats like hummus and guacamole. Vegetables go great with these. Cooking Use oil to saut or stir-fry foods instead of solid fats such as butter, margarine, or lard. Try baking, boiling, grilling, or broiling instead of frying. Remove the fatty part of meats before cooking. Steam vegetables in water or broth. Meal planning  At meals, imagine dividing your plate into fourths: One-half of  your plate is fruits and vegetables. One-fourth of your plate is whole grains. One-fourth of your plate is protein, especially lean meats, poultry, eggs, tofu, beans, or nuts. Include low-fat  dairy as part of your daily diet. Lifestyle Choose healthy options in all settings, including home, work, school, restaurants, or stores. Prepare your food safely: Wash your hands after handling raw meats. Where you prepare food, keep surfaces clean by regularly washing with hot, soapy water. Keep raw meats separate from ready-to-eat foods, such as fruits and vegetables. Cook seafood, meat, poultry, and eggs to the recommended temperature. Get a food thermometer. Store foods at safe temperatures. In general: Keep cold foods at 48F (4.4C) or below. Keep hot foods at 148F (60C) or above. Keep your freezer at [facility] (-17.8C) or below. Foods are not safe to eat if they have been between the temperatures of 40-148F (4.4-60C) for more than 2 hours. What foods should I eat? Fruits Aim to eat 1-2 cups of fresh, canned (in natural juice), or frozen fruits each day. One cup of fruit equals 1 small apple, 1 large banana, 8 large strawberries, 1 cup (237 g) canned fruit,  cup (82 g) dried fruit, or 1 cup (240 mL) 100% juice. Vegetables Aim to eat 2-4 cups of fresh and frozen vegetables each day, including different varieties and colors. One cup of vegetables equals 1 cup (91 g) broccoli or cauliflower florets, 2 medium carrots, 2 cups (150 g) raw, leafy greens, 1 large tomato, 1 large bell pepper, 1 large sweet potato, or 1 medium white potato. Grains Aim to eat 5-10 ounce-equivalents of whole grains each day. Examples of 1 ounce-equivalent of grains include 1 slice of bread, 1 cup (40 g) ready-to-eat cereal, 3 cups (24 g) popcorn, or  cup (93 g) cooked rice. Meats and other proteins Try to eat 5-7 ounce-equivalents of protein each day. Examples of 1 ounce-equivalent of protein include 1 egg,  oz nuts (12 almonds, 24 pistachios, or 7 walnut halves), 1/4 cup (90 g) cooked beans, 6 tablespoons (90 g) hummus or 1 tablespoon (16 g) peanut butter. A cut of meat or fish that is the size of a deck of  cards is about 3-4 ounce-equivalents (85 g). Of the protein you eat each week, try to have at least 8 sounce (227 g) of seafood. This is about 2 servings per week. This includes salmon, trout, herring, sardines, and anchovies. Dairy Aim to eat 3 cup-equivalents of fat-free or low-fat dairy each day. Examples of 1 cup-equivalent of dairy include 1 cup (240 mL) milk, 8 ounces (250 g) yogurt, 1 ounces (44 g) natural cheese, or 1 cup (240 mL) fortified soy milk. Fats and oils Aim for about 5 teaspoons (21 g) of fats and oils per day. Choose monounsaturated fats, such as canola and olive oils, mayonnaise made with olive oil or avocado oil, avocados, peanut butter, and most nuts, or polyunsaturated fats, such as sunflower, corn, and soybean oils, walnuts, pine nuts, sesame seeds, sunflower seeds, and flaxseed. Beverages Aim for 6 eight-ounce glasses of water per day. Limit coffee to 3-5 eight-ounce cups per day. Limit caffeinated beverages that have added calories, such as soda and energy drinks. If you drink alcohol: Limit how much you have to: 0-1 drink a day if you are female. 0-2 drinks a day if you are female. Know how much alcohol is in your drink. In the [place] one drink is one 12 oz bottle of beer (355 mL), one 5 oz glass of wine (  148 mL), or one 1 oz glass of hard liquor (44 mL). Seasoning and other foods Try not to add too much salt to your food. Try using herbs and spices instead of salt. Try not to add sugar to food. This information is based on [place] nutrition guidelines. To learn more, visit [employer]posableN[employer].be. Exact amounts may vary. You may need different amounts. This information is not intended to replace advice given to you by your health care provider. Make sure you discuss any questions you have with your health care provider. Document Revised: 03/20/2022 Document Reviewed: 03/20/2022 Elsevier Patient Education  2024 [employer].

## 2023-06-04 ENCOUNTER — Encounter: Payer: Self-pay | Admitting: Nurse Practitioner

## 2023-06-04 ENCOUNTER — Ambulatory Visit (INDEPENDENT_AMBULATORY_CARE_PROVIDER_SITE_OTHER): Payer: 59 | Admitting: Nurse Practitioner

## 2023-06-04 VITALS — BP 102/68 | HR 67 | Temp 98.0°F | Ht 64.1 in | Wt 199.0 lb

## 2023-06-04 DIAGNOSIS — E7800 Pure hypercholesterolemia, unspecified: Secondary | ICD-10-CM

## 2023-06-04 DIAGNOSIS — E559 Vitamin D deficiency, unspecified: Secondary | ICD-10-CM

## 2023-06-04 DIAGNOSIS — E66811 Obesity, class 1: Secondary | ICD-10-CM

## 2023-06-04 DIAGNOSIS — Z0000 Encounter for general adult medical examination without abnormal findings: Secondary | ICD-10-CM | POA: Diagnosis not present

## 2023-06-04 DIAGNOSIS — E6609 Other obesity due to excess calories: Secondary | ICD-10-CM | POA: Diagnosis not present

## 2023-06-04 DIAGNOSIS — Z23 Encounter for immunization: Secondary | ICD-10-CM

## 2023-06-04 DIAGNOSIS — Z6833 Body mass index (BMI) 33.0-33.9, adult: Secondary | ICD-10-CM | POA: Diagnosis not present

## 2023-06-04 NOTE — Assessment & Plan Note (Signed)
Ongoing, noted past labs.  Continue supplement at home and check levels today.

## 2023-06-04 NOTE — Progress Notes (Signed)
BP 102/68   Pulse 67   Temp 98 F (36.7 C) (Oral)   Ht 5' 4.1" (1.628 m)   Wt 199 lb (90.3 kg)   LMP 05/21/2023 (Approximate)   SpO2 98%   BMI 34.05 kg/m    Subjective:    Patient ID: Melanie Bailey, adult    DOB: 05-09-1991, 32 y.o.   MRN: [id number]  HPI: Melanie Bailey is a 32 y.o. adult presenting on 06/04/2023 for comprehensive medical examination. Current medical complaints include:none  She currently lives with: room mates Menopausal Symptoms: no  Currently does not have a job, which is a major stressor.  She reports doing well without medication for mood.  Continues to take Vitamin D supplement.    06/04/2023    8:09 AM 03/14/2023   11:03 AM 08/21/2022    4:39 PM 05/31/2022    1:16 PM 04/06/2021   10:39 AM  Depression screen PHQ 2/9  Decreased Interest 2 2 1 1 2   Down, Depressed, Hopeless 2 2 1 1 2   PHQ - 2 Score 4 4 2 2 4   Altered sleeping 2 2 2 1  0  Tired, decreased energy 2 2 2 2 1   Change in appetite 0 1 2 0 0  Feeling bad or failure about yourself  2 1 1  0 1  Trouble concentrating 2 2 2 2 2   Moving slowly or fidgety/restless 0 1 0 1 0  Suicidal thoughts 0 0 0 0 0  PHQ-9 Score 12 13 11 8 8   Difficult doing work/chores Somewhat difficult Somewhat difficult Somewhat difficult Somewhat difficult        06/04/2023    8:09 AM 03/14/2023   11:04 AM 08/21/2022    4:39 PM 05/31/2022    1:16 PM  GAD 7 : Generalized Anxiety Score  Nervous, Anxious, on Edge 1 1 1 1   Control/stop worrying 1 1 1 1   Worry too much - different things 2 1 1 2   Trouble relaxing 2 1 1 2   Restless 1 1 0 1  Easily annoyed or irritable 1 1 0 1  Afraid - awful might happen 0 0 0 0  Total GAD 7 Score 8 6 4 8   Anxiety Difficulty Somewhat difficult Somewhat difficult Not difficult at all Somewhat difficult      04/12/2021    2:33 PM 05/31/2022    1:16 PM 08/21/2022    4:39 PM 03/14/2023   11:03 AM 06/04/2023    8:09 AM  Fall Risk  Falls in the past year? 0 0 0 0 1  Was there an  injury with Fall? 0 0 0 0 0  Fall Risk Category Calculator 0 0 0 0 1  Fall Risk Category (Retired) Low Low     (RETIRED) Patient Fall Risk Level Low fall risk Low fall risk     Patient at Risk for Falls Due to No Fall Risks No Fall Risks No Fall Risks No Fall Risks No Fall Risks  Fall risk Follow up Education provided Falls evaluation completed  Falls evaluation completed Falls evaluation completed    Past Medical History:  Past Medical History:  Diagnosis Date   Allergy    seasonal    Surgical History:  Past Surgical History:  Procedure Laterality Date   APPENDECTOMY      Medications:  Current Outpatient Medications on File Prior to Visit  Medication Sig   cholecalciferol (VITAMIN D3) 25 MCG (1000 UNIT) tablet Take 1,000 Units by mouth daily.  Cranberry 50 MG CHEW Chew by mouth.   FIBER ADULT GUMMIES PO Take 2 tablets by mouth daily.   loratadine (CLARITIN) 10 MG tablet Take 10 mg by mouth daily.   No current facility-administered medications on file prior to visit.    Allergies:  No Known Allergies  Social History:  Social History   Socioeconomic History   Marital status: Single    Spouse name: Not on file   Number of children: Not on file   Years of education: Not on file   Highest education level: Not on file  Occupational History   Not on file  Tobacco Use   Smoking status: Never   Smokeless tobacco: Never  Vaping Use   Vaping status: Never Used  Substance and Sexual Activity   Alcohol use: Yes    Comment: social drinker, usually only a few times a month   Drug use: Never   Sexual activity: Not Currently    Birth control/protection: None  Other Topics Concern   Not on file  Social History Narrative   Not on file   Social Determinants of Health   Financial Resource Strain: Low Risk  (03/08/2021)   Overall Financial Resource Strain (CARDIA)    Difficulty of Paying Living Expenses: Not hard at all  Food Insecurity: No Food Insecurity (03/08/2021)    Hunger Vital Sign    Worried About Running Out of Food in the Last Year: Never true    Ran Out of Food in the Last Year: Never true  Transportation Needs: No Transportation Needs (03/08/2021)   PRAPARE - Administrator, Civil Service (Medical): No    Lack of Transportation (Non-Medical): No  Physical Activity: Inactive (03/08/2021)   Exercise Vital Sign    Days of Exercise per Week: 0 days    Minutes of Exercise per Session: 0 min  Stress: Stress Concern Present (03/08/2021)   [employer] of Occupational Health - Occupational Stress Questionnaire    Feeling of Stress : To some extent  Social Connections: Socially Isolated (03/08/2021)   Social Connection and Isolation Panel [NHANES]    Frequency of Communication with Friends and Family: More than three times a week    Frequency of Social Gatherings with Friends and Family: More than three times a week    Attends Religious Services: Never    Database administrator or Organizations: No    Attends Banker Meetings: Never    Marital Status: Never married  Intimate Partner Violence: Not At Risk (03/08/2021)   Humiliation, Afraid, Rape, and Kick questionnaire    Fear of Current or Ex-Partner: No    Emotionally Abused: No    Physically Abused: No    Sexually Abused: No   Social History   Tobacco Use  Smoking Status Never  Smokeless Tobacco Never   Social History   Substance and Sexual Activity  Alcohol Use Yes   Comment: social drinker, usually only a few times a month    Family History:  Family History  Problem Relation Age of Onset   Multiple sclerosis Mother     Past medical history, surgical history, medications, allergies, family history and social history reviewed with patient today and changes made to appropriate areas of the chart.   ROS All other ROS negative except what is listed above and in the HPI.      Objective:    BP 102/68   Pulse 67   Temp 98 F (36.7 C) (Oral)  Ht 5' 4.1"  (1.628 m)   Wt 199 lb (90.3 kg)   LMP 05/21/2023 (Approximate)   SpO2 98%   BMI 34.05 kg/m   Wt Readings from Last 3 Encounters:  06/04/23 199 lb (90.3 kg)  03/14/23 196 lb 12.8 oz (89.3 kg)  08/21/22 191 lb 6.4 oz (86.8 kg)    Physical Exam Vitals and nursing note reviewed. Exam conducted with a chaperone present.  Constitutional:      General: Yiran is awake. Bowen is not in acute distress.    Appearance: Caycee is well-developed and well-groomed. Lynzey is obese. Ozetta is not ill-appearing or toxic-appearing.  HENT:     Head: Normocephalic and atraumatic.     Right Ear: Hearing, tympanic membrane, ear canal and external ear normal. No drainage.     Left Ear: Hearing, tympanic membrane, ear canal and external ear normal. No drainage.     Nose: Nose normal.     Right Sinus: No maxillary sinus tenderness or frontal sinus tenderness.     Left Sinus: No maxillary sinus tenderness or frontal sinus tenderness.     Mouth/Throat:     Mouth: Mucous membranes are moist.     Pharynx: Oropharynx is clear. Uvula midline. No pharyngeal swelling, oropharyngeal exudate or posterior oropharyngeal erythema.  Eyes:     General: Lids are normal.        Right eye: No discharge.        Left eye: No discharge.     Extraocular Movements: Extraocular movements intact.     Conjunctiva/sclera: Conjunctivae normal.     Pupils: Pupils are equal, round, and reactive to light.     Visual Fields: Right eye visual fields normal and left eye visual fields normal.  Neck:     Thyroid: No thyromegaly.     Vascular: No carotid bruit.     Trachea: Trachea normal.  Cardiovascular:     Rate and Rhythm: Normal rate and regular rhythm.     Heart sounds: Normal heart sounds. No murmur heard.    No gallop.  Pulmonary:     Effort: Pulmonary effort is normal. No accessory muscle usage or respiratory distress.     Breath sounds: Normal breath sounds.  Chest:     Comments: Deferred per patient  request Abdominal:     General: Bowel sounds are normal.     Palpations: Abdomen is soft. There is no hepatomegaly or splenomegaly.     Tenderness: There is no abdominal tenderness.  Musculoskeletal:        General: Normal range of motion.     Cervical back: Normal range of motion and neck supple.     Right lower leg: No edema.     Left lower leg: No edema.  Lymphadenopathy:     Head:     Right side of head: No submental, submandibular, tonsillar, preauricular or posterior auricular adenopathy.     Left side of head: No submental, submandibular, tonsillar, preauricular or posterior auricular adenopathy.     Cervical: No cervical adenopathy.  Skin:    General: Skin is warm and dry.     Capillary Refill: Capillary refill takes less than 2 seconds.     Findings: No rash.  Neurological:     Mental Status: Shadavia is alert and oriented to person, place, and time.     Gait: Gait is intact.     Deep Tendon Reflexes: Reflexes are normal and symmetric.     Reflex Scores:  Brachioradialis reflexes are 2+ on the right side and 2+ on the left side.      Patellar reflexes are 2+ on the right side and 2+ on the left side. Psychiatric:        Attention and Perception: Attention normal.        Mood and Affect: Mood normal.        Speech: Speech normal.        Behavior: Behavior normal. Behavior is cooperative.        Thought Content: Thought content normal.        Judgment: Judgment normal.    Results for orders placed or performed in visit on 05/31/22  Urine Culture   Specimen: Urine   UR  Result Value Ref Range   Urine Culture, Routine Final report (A)    Organism ID, Bacteria Escherichia coli (A)    Antimicrobial Susceptibility Comment   WET PREP FOR TRICH, YEAST, CLUE   Specimen: Sterile Swab   Sterile Swab  Result Value Ref Range   Trichomonas Exam Negative Negative   Yeast Exam Negative Negative   Clue Cell Exam Negative Negative  CBC with Differential/Platelet  Result  Value Ref Range   WBC 7.1 3.4 - 10.8 x10E3/uL   RBC 4.29 3.77 - 5.28 x10E6/uL   Hemoglobin 13.2 11.1 - 15.9 g/dL   Hematocrit 57.8 46.9 - 46.6 %   MCV 94 79 - 97 fL   MCH 30.8 26.6 - 33.0 pg   MCHC 32.9 31.5 - 35.7 g/dL   RDW 62.9 52.8 - 41.3 %   Platelets 311 150 - 450 x10E3/uL   Neutrophils 68 Not Estab. %   Lymphs 25 Not Estab. %   Monocytes 7 Not Estab. %   Eos 0 Not Estab. %   Basos 0 Not Estab. %   Neutrophils Absolute 4.8 1.4 - 7.0 x10E3/uL   Lymphocytes Absolute 1.8 0.7 - 3.1 x10E3/uL   Monocytes Absolute 0.5 0.1 - 0.9 x10E3/uL   EOS (ABSOLUTE) 0.0 0.0 - 0.4 x10E3/uL   Basophils Absolute 0.0 0.0 - 0.2 x10E3/uL   Immature Granulocytes 0 Not Estab. %   Immature Grans (Abs) 0.0 0.0 - 0.1 x10E3/uL  Comprehensive metabolic panel  Result Value Ref Range   Glucose 92 70 - 99 mg/dL   BUN 8 6 - 20 mg/dL   Creatinine, Ser 2.44 0.57 - 1.00 mg/dL   eGFR 010 >27 OZ/DGU/4.40   BUN/Creatinine Ratio 13 9 - 23   Sodium 140 134 - 144 mmol/L   Potassium 3.8 3.5 - 5.2 mmol/L   Chloride 101 96 - 106 mmol/L   CO2 25 20 - 29 mmol/L   Calcium 9.3 8.7 - 10.2 mg/dL   Total Protein 7.3 6.0 - 8.5 g/dL   Albumin 4.4 3.9 - 4.9 g/dL   Globulin, Total 2.9 1.5 - 4.5 g/dL   Albumin/Globulin Ratio 1.5 1.2 - 2.2   Bilirubin Total 0.3 0.0 - 1.2 mg/dL   Alkaline Phosphatase 96 44 - 121 IU/L   AST 18 0 - 40 IU/L   ALT 12 0 - 32 IU/L  Lipid Panel w/o Chol/HDL Ratio  Result Value Ref Range   Cholesterol, Total 173 100 - 199 mg/dL   Triglycerides 74 0 - 149 mg/dL   HDL 42 >34 mg/dL   VLDL Cholesterol Cal 14 5 - 40 mg/dL   LDL Chol Calc (NIH) 742 (H) 0 - 99 mg/dL  TSH  Result Value Ref Range   TSH 1.170 0.450 -  4.500 uIU/mL  VITAMIN D 25 Hydroxy (Vit-D Deficiency, Fractures)  Result Value Ref Range   Vit D, 25-Hydroxy 31.5 30.0 - 100.0 ng/mL  Hepatitis C antibody  Result Value Ref Range   Hep C Virus Ab Non Reactive Non Reactive  HIV Antibody (routine testing w rflx)  Result Value Ref Range    HIV Screen 4th Generation wRfx Non Reactive Non Reactive      Assessment & Plan:   Problem List Items Addressed This Visit       Other   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  Noted on past labs -- recheck lipid panel today and continue diet focus.      Relevant Orders   Comprehensive metabolic panel   Lipid Panel w/o Chol/HDL Ratio   Obesity    BMI 34.05.  Recommended eating smaller high protein, low fat meals more frequently and exercising 30 mins a day 5 times a week with a goal of 10-15lb weight loss in the next 3 months. Patient voiced their understanding and motivation to adhere to these recommendations.       Relevant Orders   TSH   Vitamin D deficiency    Ongoing, noted past labs.  Continue supplement at home and check levels today.      Relevant Orders   VITAMIN D 25 Hydroxy (Vit-D Deficiency, Fractures)   Other Visit Diagnoses     Encounter for annual physical exam       Annual physical today with labs and health maintenance reviewed, discussed with patient.   Relevant Orders   CBC with Differential/Platelet        Follow up plan: Return in about 1 year (around 06/03/2024) for Annual Physical.   LABORATORY TESTING:  - Pap smear: up to date  IMMUNIZATIONS:   - Tdap: Tetanus vaccination status reviewed: refused. - Influenza: Up To Date -- [facility] 2 weeks ago - Pneumovax: Not applicable - Prevnar: Not applicable - COVID: Up to date - [facility] 2 weeks ago - HPV: Not applicable - Shingrix vaccine: Not applicable  SCREENING: -Mammogram: Not applicable  - Colonoscopy: Not applicable  - Bone Density: Not applicable  -Hearing Test: Not applicable  -Spirometry: Not applicable   PATIENT COUNSELING:   Advised to take 1 mg of folate supplement per day if capable of pregnancy.   Sexuality: Discussed sexually transmitted diseases, partner selection, use of condoms, avoidance of unintended pregnancy  and  contraceptive alternatives.   Advised to avoid cigarette smoking.  I discussed with the patient that most people either abstain from alcohol or drink within safe limits (<=14/week and <=4 drinks/occasion for males, <=7/weeks and <= 3 drinks/occasion for females) and that the risk for alcohol disorders and other health effects rises proportionally with the number of drinks per week and how often a drinker exceeds daily limits.  Discussed cessation/primary prevention of drug use and availability of treatment for abuse.   Diet: Encouraged to adjust caloric intake to maintain  or achieve ideal body weight, to reduce intake of dietary saturated fat and total fat, to limit sodium intake by avoiding high sodium foods and not adding table salt, and to maintain adequate dietary potassium and calcium preferably from fresh fruits, vegetables, and low-fat dairy products.    Stressed the importance of regular exercise  Injury prevention: Discussed safety belts, safety helmets, smoke detector, smoking near bedding or upholstery.   Dental health: Discussed importance of regular tooth brushing, flossing, and dental  visits.    NEXT PREVENTATIVE PHYSICAL DUE IN 1 YEAR. Return in about 1 year (around 06/03/2024) for Annual Physical.

## 2023-06-04 NOTE — Assessment & Plan Note (Signed)
Ongoing.  Noted on past labs -- recheck lipid panel today and continue diet focus.

## 2023-06-04 NOTE — Assessment & Plan Note (Signed)
 BMI 34.05.  Recommended eating smaller high protein, low fat meals more frequently and exercising 30 mins a day 5 times a week with a goal of 10-15lb weight loss in the next 3 months. Patient voiced their understanding and motivation to adhere to these recommendations.

## 2023-06-05 ENCOUNTER — Other Ambulatory Visit: Payer: Self-pay | Admitting: Nurse Practitioner

## 2023-06-05 DIAGNOSIS — R7989 Other specified abnormal findings of blood chemistry: Secondary | ICD-10-CM

## 2023-06-05 LAB — LIPID PANEL W/O CHOL/HDL RATIO
Cholesterol, Total: 165 mg/dL (ref 100–199)
HDL: 49 mg/dL (ref >39)
LDL Chol Calc (NIH): 96 mg/dL (ref 0–99)
Triglycerides: 108 mg/dL (ref 0–149)
VLDL Cholesterol Cal: 20 mg/dL (ref 5–40)

## 2023-06-05 LAB — COMPREHENSIVE METABOLIC PANEL
ALT: 14 [IU]/L (ref 0–32)
AST: 17 [IU]/L (ref 0–40)
Albumin: 4.2 g/dL (ref 3.9–4.9)
Alkaline Phosphatase: 107 [IU]/L (ref 44–121)
BUN/Creatinine Ratio: 11 (ref 9–23)
BUN: 7 mg/dL (ref 6–20)
Bilirubin Total: 0.3 mg/dL (ref 0.0–1.2)
CO2: 26 mmol/L (ref 20–29)
Calcium: 9.2 mg/dL (ref 8.7–10.2)
Chloride: 102 mmol/L (ref 96–106)
Creatinine, Ser: 0.65 mg/dL (ref 0.57–1.00)
Globulin, Total: 2.6 g/dL (ref 1.5–4.5)
Glucose: 88 mg/dL (ref 70–99)
Potassium: 4 mmol/L (ref 3.5–5.2)
Sodium: 139 mmol/L (ref 134–144)
Total Protein: 6.8 g/dL (ref 6.0–8.5)
eGFR: 119 mL/min/{1.73_m2} (ref >59)

## 2023-06-05 LAB — CBC WITH DIFFERENTIAL/PLATELET
Basophils Absolute: 0 10*3/uL (ref 0.0–0.2)
Basos: 1 %
EOS (ABSOLUTE): 0.1 10*3/uL (ref 0.0–0.4)
Eos: 2 %
Hematocrit: 39.4 % (ref 34.0–46.6)
Hemoglobin: 13.1 g/dL (ref 11.1–15.9)
Immature Grans (Abs): 0 10*3/uL (ref 0.0–0.1)
Immature Granulocytes: 0 %
Lymphocytes Absolute: 2.5 10*3/uL (ref 0.7–3.1)
Lymphs: 33 %
MCH: 31 pg (ref 26.6–33.0)
MCHC: 33.2 g/dL (ref 31.5–35.7)
MCV: 93 fL (ref 79–97)
Monocytes Absolute: 0.5 10*3/uL (ref 0.1–0.9)
Monocytes: 7 %
Neutrophils Absolute: 4.5 10*3/uL (ref 1.4–7.0)
Neutrophils: 57 %
Platelets: 285 10*3/uL (ref 150–450)
RBC: 4.23 x10E6/uL (ref 3.77–5.28)
RDW: 11.9 % (ref 11.7–15.4)
WBC: 7.7 10*3/uL (ref 3.4–10.8)

## 2023-06-05 LAB — VITAMIN D 25 HYDROXY (VIT D DEFICIENCY, FRACTURES): Vit D, 25-Hydroxy: 25 ng/mL — ABNORMAL LOW (ref 30.0–100.0)

## 2023-06-05 LAB — TSH: TSH: 4.79 u[IU]/mL — ABNORMAL HIGH (ref 0.450–4.500)

## 2023-07-03 ENCOUNTER — Other Ambulatory Visit: Payer: 59

## 2023-07-03 DIAGNOSIS — R7989 Other specified abnormal findings of blood chemistry: Secondary | ICD-10-CM

## 2023-07-04 ENCOUNTER — Other Ambulatory Visit: Payer: Self-pay | Admitting: Nurse Practitioner

## 2023-07-04 LAB — T4, FREE: Free T4: 1 ng/dL (ref 0.82–1.77)

## 2023-07-04 LAB — THYROID PEROXIDASE ANTIBODY: Thyroperoxidase Ab SerPl-aCnc: 21 [IU]/mL (ref 0–34)

## 2023-07-04 LAB — TSH: TSH: 5.31 u[IU]/mL — ABNORMAL HIGH (ref 0.450–4.500)

## 2023-07-04 MED ORDER — LEVOTHYROXINE SODIUM 25 MCG PO TABS: 25.0000 ug | ORAL_TABLET | Freq: Every day | ORAL | 3 refills | Status: DC

## 2023-07-04 NOTE — Progress Notes (Signed)
 Contacted via MyChart - needs 6 week follow-up visit with me please   Good morning Hollee, your labs have returned and TSH level remains elevated.  It has trended up a little.  Free T4 and antibody are normal.  I would like to start a low dose of Levothyroxine  due to ongoing TSH elevation and some of your symptoms -- hypothyroid (sluggish thyroid  that is not releasing enough hormone) can make you feel fatigued, depressed, constipated, have dry skin or hair loss + much more.  I will send in Levothyroxine  25 MCG to start taking daily, take 30 minutes before any food or medications in morning.  I will have staff call to schedule follow-up with me in 6 weeks so I can see how you are doing and recheck levels.  Any questions? Keep being stellar!!  Thank you for allowing me to participate in your care.  I appreciate you. Kindest regards, Keiron Iodice

## 2023-08-12 ENCOUNTER — Encounter: Payer: Self-pay | Admitting: Nurse Practitioner

## 2023-08-12 DIAGNOSIS — E039 Hypothyroidism, unspecified: Secondary | ICD-10-CM

## 2023-08-12 HISTORY — DX: Hypothyroidism, unspecified: E03.9

## 2023-08-12 NOTE — Patient Instructions (Signed)

## 2023-08-17 ENCOUNTER — Ambulatory Visit: Payer: Medicaid Other | Admitting: Nurse Practitioner

## 2023-08-17 ENCOUNTER — Encounter: Payer: Self-pay | Admitting: Nurse Practitioner

## 2023-08-17 VITALS — BP 105/68 | HR 80 | Temp 98.4°F | Ht 64.0 in | Wt 197.0 lb

## 2023-08-17 DIAGNOSIS — E039 Hypothyroidism, unspecified: Secondary | ICD-10-CM

## 2023-08-17 NOTE — Progress Notes (Signed)
BP 105/68   Pulse 80   Temp 98.4 F (36.9 C) (Oral)   Ht 5\' 4"  (1.626 m)   Wt 197 lb (89.4 kg)   LMP 08/06/2023 (Approximate)   SpO2 100%   BMI 33.81 kg/m    Subjective:    Patient ID: Melanie Bailey, adult    DOB: 06-18-1991, 33 y.o.   MRN: [id number]  HPI: Melanie Bailey is a 33 y.o. adult  Chief Complaint  Patient presents with   Hypothyroidism   HYPOTHYROIDISM Diagnosed on 08/12/23, started medication.  Tolerating Levothyroxine.  Maternal grandmother had hypothyroid. Thyroid control status:stable Satisfied with current treatment? yes Medication side effects: no Medication compliance: good compliance Etiology of hypothyroidism: unknown Recent dose adjustment:no Fatigue: occasional Cold intolerance: no Heat intolerance: no Weight gain: no Weight loss: no Constipation: no Diarrhea/loose stools: no Palpitations: no Lower extremity edema: no Anxiety/depressed mood: no    08/17/2023    9:51 AM 06/04/2023    8:09 AM 03/14/2023   11:03 AM 08/21/2022    4:39 PM 05/31/2022    1:16 PM  Depression screen PHQ 2/9  Decreased Interest 2 2 2 1 1   Down, Depressed, Hopeless 2 2 2 1 1   PHQ - 2 Score 4 4 4 2 2   Altered sleeping 2 2 2 2 1   Tired, decreased energy 2 2 2 2 2   Change in appetite 1 0 1 2 0  Feeling bad or failure about yourself  1 2 1 1  0  Trouble concentrating 2 2 2 2 2   Moving slowly or fidgety/restless 0 0 1 0 1  Suicidal thoughts 1 0 0 0 0  PHQ-9 Score 13 12 13 11 8   Difficult doing work/chores Somewhat difficult Somewhat difficult Somewhat difficult Somewhat difficult Somewhat difficult       08/17/2023    9:51 AM 06/04/2023    8:09 AM 03/14/2023   11:04 AM 08/21/2022    4:39 PM  GAD 7 : Generalized Anxiety Score  Nervous, Anxious, on Edge 1 1 1 1   Control/stop worrying 2 1 1 1   Worry too much - different things 2 2 1 1   Trouble relaxing 2 2 1 1   Restless 1 1 1  0  Easily annoyed or irritable 2 1 1  0  Afraid - awful might happen 1 0 0 0  Total  GAD 7 Score 11 8 6 4   Anxiety Difficulty Somewhat difficult Somewhat difficult Somewhat difficult Not difficult at all   Relevant past medical, surgical, family and social history reviewed and updated as indicated. Interim medical history since our last visit reviewed. Allergies and medications reviewed and updated.  Review of Systems  Constitutional:  Negative for activity change, appetite change, diaphoresis, fatigue and fever.  Respiratory:  Negative for cough, chest tightness and shortness of breath.   Cardiovascular:  Negative for chest pain, palpitations and leg swelling.  Gastrointestinal: Negative.   Endocrine: Negative for cold intolerance and heat intolerance.  Neurological: Negative.   Psychiatric/Behavioral: Negative.      Per HPI unless specifically indicated above     Objective:    BP 105/68   Pulse 80   Temp 98.4 F (36.9 C) (Oral)   Ht 5\' 4"  (1.626 m)   Wt 197 lb (89.4 kg)   LMP 08/06/2023 (Approximate)   SpO2 100%   BMI 33.81 kg/m   Wt Readings from Last 3 Encounters:  08/17/23 197 lb (89.4 kg)  06/04/23 199 lb (90.3 kg)  03/14/23 196 lb 12.8 oz (  89.3 kg)    Physical Exam Vitals and nursing note reviewed.  Constitutional:      General: Karinna is awake. Salli is not in acute distress.    Appearance: Cecil is well-developed and well-groomed. Vinessa is obese. Netha is not ill-appearing or toxic-appearing.  HENT:     Head: Normocephalic.     Right Ear: Hearing and external ear normal.     Left Ear: Hearing and external ear normal.  Eyes:     General: Lids are normal.        Right eye: No discharge.        Left eye: No discharge.     Conjunctiva/sclera: Conjunctivae normal.     Pupils: Pupils are equal, round, and reactive to light.  Neck:     Thyroid: No thyromegaly.     Vascular: No carotid bruit.  Cardiovascular:     Rate and Rhythm: Normal rate and regular rhythm.     Heart sounds: Normal heart sounds. No murmur heard.    No gallop.   Pulmonary:     Effort: Pulmonary effort is normal. No accessory muscle usage or respiratory distress.     Breath sounds: Normal breath sounds.  Abdominal:     General: Bowel sounds are normal. There is no distension.     Palpations: Abdomen is soft.     Tenderness: There is no abdominal tenderness.  Musculoskeletal:     Cervical back: Normal range of motion and neck supple.     Right lower leg: No edema.     Left lower leg: No edema.  Lymphadenopathy:     Cervical: No cervical adenopathy.  Skin:    General: Skin is warm and dry.  Neurological:     Mental Status: Jillene is alert and oriented to person, place, and time.     Deep Tendon Reflexes: Reflexes are normal and symmetric.     Reflex Scores:      Brachioradialis reflexes are 2+ on the right side and 2+ on the left side.      Patellar reflexes are 2+ on the right side and 2+ on the left side. Psychiatric:        Attention and Perception: Attention normal.        Mood and Affect: Mood normal.        Speech: Speech normal.        Behavior: Behavior normal. Behavior is cooperative.        Thought Content: Thought content normal.    Results for orders placed or performed in visit on 07/03/23  TSH   Collection Time: 07/03/23  8:21 AM  Result Value Ref Range   TSH 5.310 (H) 0.450 - 4.500 uIU/mL  Thyroid peroxidase antibody   Collection Time: 07/03/23  8:21 AM  Result Value Ref Range   Thyroperoxidase Ab SerPl-aCnc 21 0 - 34 IU/mL  T4, free   Collection Time: 07/03/23  8:21 AM  Result Value Ref Range   Free T4 1.00 0.82 - 1.77 ng/dL      Assessment & Plan:   Problem List Items Addressed This Visit       Endocrine   Hypothyroidism - Primary   Diagnosed 08/12/23.  Will recheck labs today and adjust Levothyroxine as needed.  She is tolerating medication.      Relevant Orders   T4, free   TSH     Follow up plan: Return for as scheduled in December.

## 2023-08-17 NOTE — Assessment & Plan Note (Signed)
Diagnosed 08/12/23.  Will recheck labs today and adjust Levothyroxine as needed.  She is tolerating medication.

## 2023-08-18 ENCOUNTER — Encounter: Payer: Self-pay | Admitting: Nurse Practitioner

## 2023-08-18 LAB — T4, FREE: Free T4: 1.1 ng/dL (ref 0.82–1.77)

## 2023-08-18 LAB — TSH: TSH: 2.17 u[IU]/mL (ref 0.450–4.500)

## 2023-08-18 NOTE — Progress Notes (Signed)
Contacted via MyChart   Good morning Melanie Bailey, your thyroid labs have returned and overall are stable with Levothyroxine at 25 MCG daily.  Continue this dosing as levels have improved and we will recheck at next visit.  Any questions? Keep being amazing!!  Thank you for allowing me to participate in your care.  I appreciate you. Kindest regards, Riely Baskett

## 2024-02-15 ENCOUNTER — Other Ambulatory Visit: Payer: Self-pay | Admitting: Nurse Practitioner

## 2024-02-18 NOTE — Telephone Encounter (Signed)
 Requested Prescriptions  Pending Prescriptions Disp Refills   levothyroxine  (SYNTHROID ) 25 MCG tablet [Pharmacy Med Name: LEVOTHYROXINE  25 MCG TABLET] 90 tablet 1    Sig: TAKE 1 TABLET BY MOUTH EVERY DAY     Endocrinology:  Hypothyroid Agents Passed - 02/18/2024  3:47 PM      Passed - TSH in normal range and within 360 days    TSH  Date Value Ref Range Status  08/17/2023 2.170 0.450 - 4.500 uIU/mL Final         Passed - Valid encounter within last 12 months    Recent Outpatient Visits           6 months ago Hypothyroidism, unspecified type   [facility] [place], Melanie DASEN, NP       Future Appointments             In 3 months Cannady, Jolene T, NP [facility]

## 2024-05-30 NOTE — Patient Instructions (Signed)
 Be Involved in Caring For Your Health:  Taking Medications When medications are taken as directed, they can greatly improve your health. But if they are not taken as prescribed, they may not work. In some cases, not taking them correctly can be harmful. To help ensure your treatment remains effective and safe, understand your medications and how to take them. Bring your medications to each visit for review by your provider.  Your lab results, notes, and after visit summary will be available on My Chart. We strongly encourage you to use this feature. If lab results are abnormal the clinic will contact you with the appropriate steps. If the clinic does not contact you assume the results are satisfactory. You can always view your results on My Chart. If you have questions regarding your health or results, please contact the clinic during office hours. You can also ask questions on My Chart.  We at [facility] are grateful that you chose us  to provide your care. We strive to provide evidence-based and compassionate care and are always looking for feedback. If you get a survey from the clinic please complete this so we can hear your opinions.  Healthy Eating, Adult Healthy eating may help you get and keep a healthy body weight, reduce the risk of chronic disease, and live a long and productive life. It is important to follow a healthy eating pattern. Your nutritional and calorie needs should be met mainly by different nutrient-rich foods. What are tips for following this plan? Reading food labels Read labels and choose the following: Reduced or low sodium products. Juices with 100% fruit juice. Foods with low saturated fats (<3 g per serving) and high polyunsaturated and monounsaturated fats. Foods with whole grains, such as whole wheat, cracked wheat, brown rice, and wild rice. Whole grains that are fortified with folic acid. This is recommended for females who are pregnant or who want to  become pregnant. Read labels and do not eat or drink the following: Foods or drinks with added sugars. These include foods that contain brown sugar, corn sweetener, corn syrup, dextrose , fructose, glucose, high-fructose corn syrup, honey, invert sugar, lactose, malt syrup, maltose, molasses, raw sugar, sucrose, trehalose, or turbinado sugar. Limit your intake of added sugars to less than 10% of your total daily calories. Do not eat more than the following amounts of added sugar per day: 6 teaspoons (25 g) for females. 9 teaspoons (38 g) for males. Foods that contain processed or refined starches and grains. Refined grain products, such as white flour, degermed cornmeal, white bread, and white rice. Shopping Choose nutrient-rich snacks, such as vegetables, whole fruits, and nuts. Avoid high-calorie and high-sugar snacks, such as potato chips, fruit snacks, and candy. Use oil-based dressings and spreads on foods instead of solid fats such as butter, margarine, sour cream, or cream cheese. Limit pre-made sauces, mixes, and instant products such as flavored rice, instant noodles, and ready-made pasta. Try more plant-protein sources, such as tofu, tempeh, black beans, edamame, lentils, nuts, and seeds. Explore eating plans such as the Mediterranean diet or vegetarian diet. Try heart-healthy dips made with beans and healthy fats like hummus and guacamole. Vegetables go great with these. Cooking Use oil to saut or stir-fry foods instead of solid fats such as butter, margarine, or lard. Try baking, boiling, grilling, or broiling instead of frying. Remove the fatty part of meats before cooking. Steam vegetables in water  or broth. Meal planning  At meals, imagine dividing your plate into fourths: One-half of  your plate is fruits and vegetables. One-fourth of your plate is whole grains. One-fourth of your plate is protein, especially lean meats, poultry, eggs, tofu, beans, or nuts. Include low-fat  dairy as part of your daily diet. Lifestyle Choose healthy options in all settings, including home, work, school, restaurants, or stores. Prepare your food safely: Wash your hands after handling raw meats. Where you prepare food, keep surfaces clean by regularly washing with hot, soapy water . Keep raw meats separate from ready-to-eat foods, such as fruits and vegetables. Cook seafood, meat, poultry, and eggs to the recommended temperature. Get a food thermometer. Store foods at safe temperatures. In general: Keep cold foods at 84F (4.4C) or below. Keep hot foods at 184F (60C) or above. Keep your freezer at [facility] (-17.8C) or below. Foods are not safe to eat if they have been between the temperatures of 40-184F (4.4-60C) for more than 2 hours. What foods should I eat? Fruits Aim to eat 1-2 cups of fresh, canned (in natural juice), or frozen fruits each day. One cup of fruit equals 1 small apple, 1 large banana, 8 large strawberries, 1 cup (237 g) canned fruit,  cup (82 g) dried fruit, or 1 cup (240 mL) 100% juice. Vegetables Aim to eat 2-4 cups of fresh and frozen vegetables each day, including different varieties and colors. One cup of vegetables equals 1 cup (91 g) broccoli or cauliflower florets, 2 medium carrots, 2 cups (150 g) raw, leafy greens, 1 large tomato, 1 large bell pepper, 1 large sweet potato, or 1 medium white potato. Grains Aim to eat 5-10 ounce-equivalents of whole grains each day. Examples of 1 ounce-equivalent of grains include 1 slice of bread, 1 cup (40 g) ready-to-eat cereal, 3 cups (24 g) popcorn, or  cup (93 g) cooked rice. Meats and other proteins Try to eat 5-7 ounce-equivalents of protein each day. Examples of 1 ounce-equivalent of protein include 1 egg,  oz nuts (12 almonds, 24 pistachios, or 7 walnut halves), 1/4 cup (90 g) cooked beans, 6 tablespoons (90 g) hummus or 1 tablespoon (16 g) peanut butter. A cut of meat or fish that is the size of a deck of  cards is about 3-4 ounce-equivalents (85 g). Of the protein you eat each week, try to have at least 8 sounce (227 g) of seafood. This is about 2 servings per week. This includes salmon, trout, herring, sardines, and anchovies. Dairy Aim to eat 3 cup-equivalents of fat-free or low-fat dairy each day. Examples of 1 cup-equivalent of dairy include 1 cup (240 mL) milk, 8 ounces (250 g) yogurt, 1 ounces (44 g) natural cheese, or 1 cup (240 mL) fortified soy milk. Fats and oils Aim for about 5 teaspoons (21 g) of fats and oils per day. Choose monounsaturated fats, such as canola and olive oils, mayonnaise made with olive oil or avocado oil, avocados, peanut butter, and most nuts, or polyunsaturated fats, such as sunflower, corn, and soybean oils, walnuts, pine nuts, sesame seeds, sunflower seeds, and flaxseed. Beverages Aim for 6 eight-ounce glasses of water  per day. Limit coffee to 3-5 eight-ounce cups per day. Limit caffeinated beverages that have added calories, such as soda and energy drinks. If you drink alcohol: Limit how much you have to: 0-1 drink a day if you are female. 0-2 drinks a day if you are female. Know how much alcohol is in your drink. In the [place] one drink is one 12 oz bottle of beer (355 mL), one 5 oz glass of wine (  148 mL), or one 1 oz glass of hard liquor (44 mL). Seasoning and other foods Try not to add too much salt to your food. Try using herbs and spices instead of salt. Try not to add sugar to food. This information is based on [place] nutrition guidelines. To learn more, visit [employer]. Exact amounts may vary. You may need different amounts. This information is not intended to replace advice given to you by your health care provider. Make sure you discuss any questions you have with your health care provider. Document Revised: 03/20/2022 Document Reviewed: 03/20/2022 Elsevier Patient Education  2024 [employer].

## 2024-06-04 ENCOUNTER — Ambulatory Visit: Payer: Self-pay | Admitting: Nurse Practitioner

## 2024-06-04 ENCOUNTER — Encounter: Payer: Self-pay | Admitting: Nurse Practitioner

## 2024-06-04 VITALS — BP 115/76 | HR 69 | Temp 97.9°F | Resp 17 | Ht 64.02 in | Wt 196.6 lb

## 2024-06-04 DIAGNOSIS — Z6833 Body mass index (BMI) 33.0-33.9, adult: Secondary | ICD-10-CM | POA: Diagnosis not present

## 2024-06-04 DIAGNOSIS — Z0000 Encounter for general adult medical examination without abnormal findings: Secondary | ICD-10-CM | POA: Diagnosis not present

## 2024-06-04 DIAGNOSIS — Z23 Encounter for immunization: Secondary | ICD-10-CM | POA: Diagnosis not present

## 2024-06-04 DIAGNOSIS — E7800 Pure hypercholesterolemia, unspecified: Secondary | ICD-10-CM

## 2024-06-04 DIAGNOSIS — E039 Hypothyroidism, unspecified: Secondary | ICD-10-CM

## 2024-06-04 DIAGNOSIS — E6609 Other obesity due to excess calories: Secondary | ICD-10-CM | POA: Diagnosis not present

## 2024-06-04 DIAGNOSIS — E559 Vitamin D deficiency, unspecified: Secondary | ICD-10-CM | POA: Diagnosis not present

## 2024-06-04 DIAGNOSIS — E66811 Obesity, class 1: Secondary | ICD-10-CM | POA: Diagnosis not present

## 2024-06-04 MED ORDER — LEVOTHYROXINE SODIUM 25 MCG PO TABS: 25.0000 ug | ORAL_TABLET | Freq: Every day | ORAL | 3 refills | Status: AC

## 2024-06-04 NOTE — Assessment & Plan Note (Signed)
Ongoing, noted past labs.  Continue supplement at home and check levels today.

## 2024-06-04 NOTE — Assessment & Plan Note (Signed)
 Diagnosed 08/12/23.  Will recheck labs today and adjust Levothyroxine as needed.  She is tolerating medication.

## 2024-06-04 NOTE — Progress Notes (Signed)
 BP 115/76 (BP Location: Left Arm, Patient Position: Sitting, Cuff Size: Large)   Pulse 69   Temp 97.9 F (36.6 C) (Oral)   Resp 17   Ht 5' 4.02 (1.626 m)   Wt 196 lb 9.6 oz (89.2 kg)   LMP  (LMP Unknown)   SpO2 100%   BMI 33.73 kg/m    Subjective:    Patient ID: Melanie Bailey, adult    DOB: [birth year], 33 y.o.   MRN: [id number]  HPI: Melanie Bailey is a 33 y.o. adult presenting on 06/04/2024 for comprehensive medical examination. Current medical complaints include:none  She currently lives with: room mates Menopausal Symptoms: no  HYPOTHYROIDISM Takes Levothyroxine  daily. Thyroid  control status:stable Satisfied with current treatment? yes Medication side effects: no Medication compliance: good compliance Etiology of hypothyroidism: unknown Recent dose adjustment:no Fatigue: no Cold intolerance: yes -- gets cold allergy with hives Heat intolerance: no Weight gain: no Weight loss: no Constipation: no Diarrhea/loose stools: no Palpitations: no Lower extremity edema: no Anxiety/depressed mood: no      06/04/2024    8:26 AM 08/17/2023    9:51 AM 06/04/2023    8:09 AM 03/14/2023   11:03 AM 08/21/2022    4:39 PM  Depression screen PHQ 2/9  Decreased Interest 1 2 2 2 1   Down, Depressed, Hopeless 0 2 2 2 1   PHQ - 2 Score 1 4 4 4 2   Altered sleeping 1 2 2 2 2   Tired, decreased energy 1 2 2 2 2   Change in appetite 0 1 0 1 2  Feeling bad or failure about yourself  0 1 2 1 1   Trouble concentrating 1 2 2 2 2   Moving slowly or fidgety/restless 0 0 0 1 0  Suicidal thoughts 0 1 0 0 0  PHQ-9 Score 4 13  12  13  11    Difficult doing work/chores Not difficult at all Somewhat difficult Somewhat difficult Somewhat difficult Somewhat difficult     Data saved with a previous flowsheet row definition       06/04/2024    8:26 AM 08/17/2023    9:51 AM 06/04/2023    8:09 AM 03/14/2023   11:04 AM  GAD 7 : Generalized Anxiety Score  Nervous, Anxious, on Edge 0 1 1 1    Control/stop worrying 0 2 1 1   Worry too much - different things 1 2 2 1   Trouble relaxing 0 2 2 1   Restless 0 1 1 1   Easily annoyed or irritable 0 2 1 1   Afraid - awful might happen 0 1 0 0  Total GAD 7 Score 1 11 8 6   Anxiety Difficulty Not difficult at all Somewhat difficult Somewhat difficult Somewhat difficult      08/21/2022    4:39 PM 03/14/2023   11:03 AM 06/04/2023    8:09 AM 08/17/2023    9:51 AM 06/04/2024    8:26 AM  Fall Risk  Falls in the past year? 0 0 1 1 0  Was there an injury with Fall? 0  0  0  0  0  Fall Risk Category Calculator 0 0 1 1 0  Patient at Risk for Falls Due to No Fall Risks No Fall Risks No Fall Risks No Fall Risks No Fall Risks  Fall risk Follow up  Falls evaluation completed Falls evaluation completed Falls evaluation completed Falls evaluation completed     Data saved with a previous flowsheet row definition    Past Medical History:  Past Medical History:  Diagnosis Date   Allergy    seasonal   Hypothyroidism 08/12/2023    Surgical History:  Past Surgical History:  Procedure Laterality Date   APPENDECTOMY      Medications:  Current Outpatient Medications on File Prior to Visit  Medication Sig   Cholecalciferol (VITAMIN D ) 50 MCG (2000 UT) CAPS Take 1 capsule by mouth daily at 2 PM.   Cranberry 50 MG CHEW Chew by mouth.   loratadine (CLARITIN) 10 MG tablet Take 10 mg by mouth daily.   No current facility-administered medications on file prior to visit.    Allergies:  No Known Allergies  Social History:  Social History   Socioeconomic History   Marital status: Single    Spouse name: Not on file   Number of children: Not on file   Years of education: Not on file   Highest education level: Bachelor's degree (e.g., BA, AB, BS)  Occupational History   Not on file  Tobacco Use   Smoking status: Never   Smokeless tobacco: Never  Vaping Use   Vaping status: Never Used  Substance and Sexual Activity   Alcohol use: Yes     Comment: social drinker, usually only a few times a month   Drug use: Never   Sexual activity: Not Currently    Birth control/protection: None  Other Topics Concern   Not on file  Social History Narrative   Not on file   Social Drivers of Health   Financial Resource Strain: Medium Risk (06/04/2024)   Overall Financial Resource Strain (CARDIA)    Difficulty of Paying Living Expenses: Somewhat hard  Food Insecurity: No Food Insecurity (06/04/2024)   Hunger Vital Sign    Worried About Running Out of Food in the Last Year: Never true    Ran Out of Food in the Last Year: Never true  Transportation Needs: No Transportation Needs (06/04/2024)   PRAPARE - Administrator, Civil Service (Medical): No    Lack of Transportation (Non-Medical): No  Physical Activity: Unknown (06/04/2024)   Exercise Vital Sign    Days of Exercise per Week: Patient declined    Minutes of Exercise per Session: Not on file  Stress: No Stress Concern Present (06/04/2024)   [employer] of Occupational Health - Occupational Stress Questionnaire    Feeling of Stress: Only a little  Social Connections: Unknown (06/04/2024)   Social Connection and Isolation Panel    Frequency of Communication with Friends and Family: More than three times a week    Frequency of Social Gatherings with Friends and Family: More than three times a week    Attends Religious Services: Never    Database Administrator or Organizations: Patient declined    Attends Banker Meetings: Not on file    Marital Status: Never married  Intimate Partner Violence: Not At Risk (06/04/2024)   Humiliation, Afraid, Rape, and Kick questionnaire    Fear of Current or Ex-Partner: No    Emotionally Abused: No    Physically Abused: No    Sexually Abused: No   Social History   Tobacco Use  Smoking Status Never  Smokeless Tobacco Never   Social History   Substance and Sexual Activity  Alcohol Use Yes   Comment: social drinker,  usually only a few times a month    Family History:  Family History  Problem Relation Age of Onset   Multiple sclerosis Mother    Past medical history, surgical  history, medications, allergies, family history and social history reviewed with patient today and changes made to appropriate areas of the chart.   ROS All other ROS negative except what is listed above and in the HPI.      Objective:    BP 115/76 (BP Location: Left Arm, Patient Position: Sitting, Cuff Size: Large)   Pulse 69   Temp 97.9 F (36.6 C) (Oral)   Resp 17   Ht 5' 4.02 (1.626 m)   Wt 196 lb 9.6 oz (89.2 kg)   LMP  (LMP Unknown)   SpO2 100%   BMI 33.73 kg/m   Wt Readings from Last 3 Encounters:  06/04/24 196 lb 9.6 oz (89.2 kg)  08/17/23 197 lb (89.4 kg)  06/04/23 199 lb (90.3 kg)    Physical Exam Vitals and nursing note reviewed. Exam conducted with a chaperone present.  Constitutional:      General: Melanie Bailey is awake. Melanie Bailey is not in acute distress.    Appearance: Melanie Bailey is well-developed and well-groomed. Melanie Bailey is obese. Melanie Bailey is not ill-appearing or toxic-appearing.  HENT:     Head: Normocephalic and atraumatic.     Right Ear: Hearing, tympanic membrane, ear canal and external ear normal. No drainage.     Left Ear: Hearing, tympanic membrane, ear canal and external ear normal. No drainage.     Nose: Nose normal.     Right Sinus: No maxillary sinus tenderness or frontal sinus tenderness.     Left Sinus: No maxillary sinus tenderness or frontal sinus tenderness.     Mouth/Throat:     Mouth: Mucous membranes are moist.     Pharynx: Oropharynx is clear. Uvula midline. No pharyngeal swelling, oropharyngeal exudate or posterior oropharyngeal erythema.  Eyes:     General: Lids are normal.        Right eye: No discharge.        Left eye: No discharge.     Extraocular Movements: Extraocular movements intact.     Conjunctiva/sclera: Conjunctivae normal.     Pupils: Pupils are equal, round, and  reactive to light.     Visual Fields: Right eye visual fields normal and left eye visual fields normal.  Neck:     Thyroid : No thyromegaly.     Vascular: No carotid bruit.     Trachea: Trachea normal.  Cardiovascular:     Rate and Rhythm: Normal rate and regular rhythm.     Heart sounds: Normal heart sounds. No murmur heard.    No gallop.  Pulmonary:     Effort: Pulmonary effort is normal. No accessory muscle usage or respiratory distress.     Breath sounds: Normal breath sounds.  Chest:     Comments: Deferred per patient request Abdominal:     General: Bowel sounds are normal.     Palpations: Abdomen is soft. There is no hepatomegaly or splenomegaly.     Tenderness: There is no abdominal tenderness.  Musculoskeletal:        General: Normal range of motion.     Cervical back: Normal range of motion and neck supple.     Right lower leg: No edema.     Left lower leg: No edema.  Lymphadenopathy:     Head:     Right side of head: No submental, submandibular, tonsillar, preauricular or posterior auricular adenopathy.     Left side of head: No submental, submandibular, tonsillar, preauricular or posterior auricular adenopathy.     Cervical: No cervical adenopathy.  Skin:  General: Skin is warm and dry.     Capillary Refill: Capillary refill takes less than 2 seconds.     Findings: No rash.  Neurological:     Mental Status: Melanie Bailey is alert and oriented to person, place, and time.     Gait: Gait is intact.     Deep Tendon Reflexes: Reflexes are normal and symmetric.     Reflex Scores:      Brachioradialis reflexes are 2+ on the right side and 2+ on the left side.      Patellar reflexes are 2+ on the right side and 2+ on the left side. Psychiatric:        Attention and Perception: Attention normal.        Mood and Affect: Mood normal.        Speech: Speech normal.        Behavior: Behavior normal. Behavior is cooperative.        Thought Content: Thought content normal.         Judgment: Judgment normal.    Results for orders placed or performed in visit on 08/17/23  T4, free   Collection Time: 08/17/23 10:06 AM  Result Value Ref Range   Free T4 1.10 0.82 - 1.77 ng/dL  TSH   Collection Time: 08/17/23 10:06 AM  Result Value Ref Range   TSH 2.170 0.450 - 4.500 uIU/mL      Assessment & Plan:   Problem List Items Addressed This Visit       Endocrine   Hypothyroidism - Primary   Diagnosed 08/12/23.  Will recheck labs today and adjust Levothyroxine  as needed.  She is tolerating medication.      Relevant Medications   levothyroxine  (SYNTHROID ) 25 MCG tablet   Other Relevant Orders   T4, free   CBC with Differential/Platelet   TSH     Other   Vitamin D  deficiency   Ongoing, noted past labs.  Continue supplement at home and check levels today.      Relevant Orders   VITAMIN D  25 Hydroxy (Vit-D Deficiency, Fractures)   Obesity   BMI 33.73.  Recommended eating smaller high protein, low fat meals more frequently and exercising 30 mins a day 5 times a week with a goal of 10-15lb weight loss in the next 3 months. Patient voiced their understanding and motivation to adhere to these recommendations.       Elevated low density lipoprotein (LDL) cholesterol level   Ongoing.  Noted on past labs -- recheck lipid panel today and continue diet focus.      Relevant Orders   Comprehensive metabolic panel with GFR   Lipid Panel w/o Chol/HDL Ratio   Other Visit Diagnoses       Flu vaccine need       Flu vaccine today, educated patient.     Encounter for annual physical exam       Annual physical today with labs and health maintenance reviewed, discussed with patient.        Follow up plan: Return in about 1 year (around 06/04/2025) for Annual Physical.   LABORATORY TESTING:  - Pap smear: up to date  IMMUNIZATIONS:   - Tdap: Tetanus vaccination status reviewed: refused. - Influenza: Up To Date -- [facility]  - Pneumovax: Not applicable - Prevnar:  Not applicable - COVID: Up to date - [facility]  - HPV: Not applicable - Shingrix vaccine: Not applicable  SCREENING: -Mammogram: Not applicable  - Colonoscopy: Not applicable  -  Bone Density: Not applicable  -Hearing Test: Not applicable  -Spirometry: Not applicable   PATIENT COUNSELING:   Advised to take 1 mg of folate supplement per day if capable of pregnancy.   Sexuality: Discussed sexually transmitted diseases, partner selection, use of condoms, avoidance of unintended pregnancy  and contraceptive alternatives.   Advised to avoid cigarette smoking.  I discussed with the patient that most people either abstain from alcohol or drink within safe limits (<=14/week and <=4 drinks/occasion for males, <=7/weeks and <= 3 drinks/occasion for females) and that the risk for alcohol disorders and other health effects rises proportionally with the number of drinks per week and how often a drinker exceeds daily limits.  Discussed cessation/primary prevention of drug use and availability of treatment for abuse.   Diet: Encouraged to adjust caloric intake to maintain  or achieve ideal body weight, to reduce intake of dietary saturated fat and total fat, to limit sodium intake by avoiding high sodium foods and not adding table salt, and to maintain adequate dietary potassium and calcium preferably from fresh fruits, vegetables, and low-fat dairy products.    Stressed the importance of regular exercise  Injury prevention: Discussed safety belts, safety helmets, smoke detector, smoking near bedding or upholstery.   Dental health: Discussed importance of regular tooth brushing, flossing, and dental visits.    NEXT PREVENTATIVE PHYSICAL DUE IN 1 YEAR. Return in about 1 year (around 06/04/2025) for Annual Physical.

## 2024-06-04 NOTE — Assessment & Plan Note (Signed)
BMI 33.73.  Recommended eating smaller high protein, low fat meals more frequently and exercising 30 mins a day 5 times a week with a goal of 10-15lb weight loss in the next 3 months. Patient voiced their understanding and motivation to adhere to these recommendations.  

## 2024-06-04 NOTE — Assessment & Plan Note (Signed)
 Ongoing.  Noted on past labs -- recheck lipid panel today and continue diet focus.

## 2024-06-05 LAB — LIPID PANEL W/O CHOL/HDL RATIO
Cholesterol, Total: 148 mg/dL (ref 100–199)
HDL: 44 mg/dL (ref >39)
LDL Chol Calc (NIH): 94 mg/dL (ref 0–99)
Triglycerides: 43 mg/dL (ref 0–149)
VLDL Cholesterol Cal: 10 mg/dL (ref 5–40)

## 2024-06-05 LAB — CBC WITH DIFFERENTIAL/PLATELET
Basophils Absolute: 0 x10E3/uL (ref 0.0–0.2)
Basos: 1 %
EOS (ABSOLUTE): 0.1 x10E3/uL (ref 0.0–0.4)
Eos: 2 %
Hematocrit: 40.5 % (ref 34.0–46.6)
Hemoglobin: 13.2 g/dL (ref 11.1–15.9)
Immature Grans (Abs): 0 x10E3/uL (ref 0.0–0.1)
Immature Granulocytes: 0 %
Lymphocytes Absolute: 1.8 x10E3/uL (ref 0.7–3.1)
Lymphs: 30 %
MCH: 31.1 pg (ref 26.6–33.0)
MCHC: 32.6 g/dL (ref 31.5–35.7)
MCV: 95 fL (ref 79–97)
Monocytes Absolute: 0.5 x10E3/uL (ref 0.1–0.9)
Monocytes: 8 %
Neutrophils Absolute: 3.6 x10E3/uL (ref 1.4–7.0)
Neutrophils: 59 %
Platelets: 288 x10E3/uL (ref 150–450)
RBC: 4.25 x10E6/uL (ref 3.77–5.28)
RDW: 11.8 % (ref 11.7–15.4)
WBC: 6.1 x10E3/uL (ref 3.4–10.8)

## 2024-06-05 LAB — COMPREHENSIVE METABOLIC PANEL WITH GFR
ALT: 12 IU/L (ref 0–32)
AST: 15 IU/L (ref 0–40)
Albumin: 4.4 g/dL (ref 3.9–4.9)
Alkaline Phosphatase: 93 IU/L (ref 41–116)
BUN/Creatinine Ratio: 14 (ref 9–23)
BUN: 9 mg/dL (ref 6–20)
Bilirubin Total: 0.4 mg/dL (ref 0.0–1.2)
CO2: 24 mmol/L (ref 20–29)
Calcium: 8.9 mg/dL (ref 8.7–10.2)
Chloride: 105 mmol/L (ref 96–106)
Creatinine, Ser: 0.65 mg/dL (ref 0.57–1.00)
Globulin, Total: 2.4 g/dL (ref 1.5–4.5)
Glucose: 87 mg/dL (ref 70–99)
Potassium: 4.1 mmol/L (ref 3.5–5.2)
Sodium: 142 mmol/L (ref 134–144)
Total Protein: 6.8 g/dL (ref 6.0–8.5)
eGFR: 118 mL/min/1.73 (ref >59)

## 2024-06-05 LAB — T4, FREE: Free T4: 1.1 ng/dL (ref 0.82–1.77)

## 2024-06-05 LAB — TSH: TSH: 1.77 u[IU]/mL (ref 0.450–4.500)

## 2024-06-05 LAB — VITAMIN D 25 HYDROXY (VIT D DEFICIENCY, FRACTURES): Vit D, 25-Hydroxy: 47.7 ng/mL (ref 30.0–100.0)

## 2024-06-06 ENCOUNTER — Ambulatory Visit: Payer: Self-pay | Admitting: Nurse Practitioner

## 2024-06-06 NOTE — Progress Notes (Signed)
 Contacted via MyChart  Good day Melanie Bailey, your labs have returned and overall look fantastic. No changes needed!! Any questions? Keep being incredible!!  Thank you for allowing me to participate in your care.  I appreciate you. Kindest regards, Lasya Vetter

## 2025-06-24 ENCOUNTER — Encounter: Admitting: Nurse Practitioner
